# Patient Record
Sex: Female | Born: 1935
Health system: Southern US, Community
[De-identification: ages and names within clinical notes are randomized; demographics above are authoritative.]

## PROBLEM LIST (undated history)

## (undated) DIAGNOSIS — Z973 Presence of spectacles and contact lenses: Secondary | ICD-10-CM

## (undated) DIAGNOSIS — F419 Anxiety disorder, unspecified: Secondary | ICD-10-CM

## (undated) DIAGNOSIS — K219 Gastro-esophageal reflux disease without esophagitis: Secondary | ICD-10-CM

## (undated) DIAGNOSIS — S22000A Wedge compression fracture of unspecified thoracic vertebra, initial encounter for closed fracture: Secondary | ICD-10-CM

## (undated) DIAGNOSIS — M1990 Unspecified osteoarthritis, unspecified site: Secondary | ICD-10-CM

## (undated) DIAGNOSIS — J479 Bronchiectasis, uncomplicated: Secondary | ICD-10-CM

## (undated) DIAGNOSIS — S1121XA Laceration without foreign body of pharynx and cervical esophagus, initial encounter: Secondary | ICD-10-CM

## (undated) DIAGNOSIS — J189 Pneumonia, unspecified organism: Secondary | ICD-10-CM

## (undated) DIAGNOSIS — Z972 Presence of dental prosthetic device (complete) (partial): Secondary | ICD-10-CM

## (undated) HISTORY — PX: HERNIA REPAIR: SHX51

## (undated) HISTORY — PX: FOOT NEUROMA SURGERY: SHX646

## (undated) HISTORY — PX: MULTIPLE TOOTH EXTRACTIONS: SHX2053

## (undated) HISTORY — PX: APPENDECTOMY: SHX54

## (undated) HISTORY — PX: EYE SURGERY: SHX253

## (undated) HISTORY — PX: COLONOSCOPY: SHX174

## (undated) HISTORY — PX: TUBAL LIGATION: SHX77

## (undated) HISTORY — PX: CHOLECYSTECTOMY: SHX55

## (undated) HISTORY — PX: CATARACT EXTRACTION W/ INTRAOCULAR LENS  IMPLANT, BILATERAL: SHX1307

## (undated) HISTORY — PX: FRACTURE SURGERY: SHX138

---

## 2011-08-02 DIAGNOSIS — M25539 Pain in unspecified wrist: Secondary | ICD-10-CM | POA: Diagnosis not present

## 2011-08-06 DIAGNOSIS — M25539 Pain in unspecified wrist: Secondary | ICD-10-CM | POA: Diagnosis not present

## 2011-08-08 DIAGNOSIS — M25539 Pain in unspecified wrist: Secondary | ICD-10-CM | POA: Diagnosis not present

## 2011-08-14 DIAGNOSIS — M25539 Pain in unspecified wrist: Secondary | ICD-10-CM | POA: Diagnosis not present

## 2011-08-16 DIAGNOSIS — M810 Age-related osteoporosis without current pathological fracture: Secondary | ICD-10-CM | POA: Diagnosis not present

## 2011-08-21 DIAGNOSIS — M25539 Pain in unspecified wrist: Secondary | ICD-10-CM | POA: Diagnosis not present

## 2011-08-27 DIAGNOSIS — S52599A Other fractures of lower end of unspecified radius, initial encounter for closed fracture: Secondary | ICD-10-CM | POA: Diagnosis not present

## 2011-09-05 DIAGNOSIS — M25539 Pain in unspecified wrist: Secondary | ICD-10-CM | POA: Diagnosis not present

## 2011-09-17 DIAGNOSIS — R634 Abnormal weight loss: Secondary | ICD-10-CM | POA: Diagnosis not present

## 2011-09-17 DIAGNOSIS — R079 Chest pain, unspecified: Secondary | ICD-10-CM | POA: Diagnosis not present

## 2011-09-25 DIAGNOSIS — M25539 Pain in unspecified wrist: Secondary | ICD-10-CM | POA: Diagnosis not present

## 2011-10-04 DIAGNOSIS — M25539 Pain in unspecified wrist: Secondary | ICD-10-CM | POA: Diagnosis not present

## 2011-10-08 DIAGNOSIS — S52599A Other fractures of lower end of unspecified radius, initial encounter for closed fracture: Secondary | ICD-10-CM | POA: Diagnosis not present

## 2011-11-07 DIAGNOSIS — M810 Age-related osteoporosis without current pathological fracture: Secondary | ICD-10-CM | POA: Diagnosis not present

## 2011-11-07 DIAGNOSIS — Z713 Dietary counseling and surveillance: Secondary | ICD-10-CM | POA: Diagnosis not present

## 2011-11-07 DIAGNOSIS — K219 Gastro-esophageal reflux disease without esophagitis: Secondary | ICD-10-CM | POA: Diagnosis not present

## 2011-11-07 DIAGNOSIS — R634 Abnormal weight loss: Secondary | ICD-10-CM | POA: Diagnosis not present

## 2011-12-20 DIAGNOSIS — R634 Abnormal weight loss: Secondary | ICD-10-CM | POA: Diagnosis not present

## 2011-12-20 DIAGNOSIS — IMO0002 Reserved for concepts with insufficient information to code with codable children: Secondary | ICD-10-CM | POA: Diagnosis not present

## 2012-01-09 DIAGNOSIS — R509 Fever, unspecified: Secondary | ICD-10-CM | POA: Diagnosis not present

## 2012-01-09 DIAGNOSIS — R05 Cough: Secondary | ICD-10-CM | POA: Diagnosis not present

## 2012-01-10 DIAGNOSIS — J449 Chronic obstructive pulmonary disease, unspecified: Secondary | ICD-10-CM | POA: Diagnosis not present

## 2012-01-10 DIAGNOSIS — R918 Other nonspecific abnormal finding of lung field: Secondary | ICD-10-CM | POA: Diagnosis not present

## 2012-01-10 DIAGNOSIS — J189 Pneumonia, unspecified organism: Secondary | ICD-10-CM | POA: Diagnosis not present

## 2012-01-14 DIAGNOSIS — IMO0002 Reserved for concepts with insufficient information to code with codable children: Secondary | ICD-10-CM | POA: Diagnosis not present

## 2012-01-14 DIAGNOSIS — J189 Pneumonia, unspecified organism: Secondary | ICD-10-CM | POA: Diagnosis not present

## 2012-01-22 DIAGNOSIS — K449 Diaphragmatic hernia without obstruction or gangrene: Secondary | ICD-10-CM | POA: Diagnosis not present

## 2012-01-22 DIAGNOSIS — J189 Pneumonia, unspecified organism: Secondary | ICD-10-CM | POA: Diagnosis not present

## 2012-01-22 DIAGNOSIS — J438 Other emphysema: Secondary | ICD-10-CM | POA: Diagnosis not present

## 2012-02-05 DIAGNOSIS — K449 Diaphragmatic hernia without obstruction or gangrene: Secondary | ICD-10-CM | POA: Diagnosis not present

## 2012-02-05 DIAGNOSIS — J189 Pneumonia, unspecified organism: Secondary | ICD-10-CM | POA: Diagnosis not present

## 2012-02-21 DIAGNOSIS — IMO0002 Reserved for concepts with insufficient information to code with codable children: Secondary | ICD-10-CM | POA: Diagnosis not present

## 2012-02-21 DIAGNOSIS — G4700 Insomnia, unspecified: Secondary | ICD-10-CM | POA: Diagnosis not present

## 2012-02-21 DIAGNOSIS — J479 Bronchiectasis, uncomplicated: Secondary | ICD-10-CM | POA: Diagnosis not present

## 2012-02-21 DIAGNOSIS — M810 Age-related osteoporosis without current pathological fracture: Secondary | ICD-10-CM | POA: Diagnosis not present

## 2012-04-02 DIAGNOSIS — K219 Gastro-esophageal reflux disease without esophagitis: Secondary | ICD-10-CM | POA: Diagnosis not present

## 2012-04-02 DIAGNOSIS — IMO0002 Reserved for concepts with insufficient information to code with codable children: Secondary | ICD-10-CM | POA: Diagnosis not present

## 2012-04-02 DIAGNOSIS — J479 Bronchiectasis, uncomplicated: Secondary | ICD-10-CM | POA: Diagnosis not present

## 2012-05-29 DIAGNOSIS — M9981 Other biomechanical lesions of cervical region: Secondary | ICD-10-CM | POA: Diagnosis not present

## 2012-05-29 DIAGNOSIS — S139XXA Sprain of joints and ligaments of unspecified parts of neck, initial encounter: Secondary | ICD-10-CM | POA: Diagnosis not present

## 2012-06-02 DIAGNOSIS — M9981 Other biomechanical lesions of cervical region: Secondary | ICD-10-CM | POA: Diagnosis not present

## 2012-06-02 DIAGNOSIS — S139XXA Sprain of joints and ligaments of unspecified parts of neck, initial encounter: Secondary | ICD-10-CM | POA: Diagnosis not present

## 2012-06-04 DIAGNOSIS — S139XXA Sprain of joints and ligaments of unspecified parts of neck, initial encounter: Secondary | ICD-10-CM | POA: Diagnosis not present

## 2012-06-04 DIAGNOSIS — M9981 Other biomechanical lesions of cervical region: Secondary | ICD-10-CM | POA: Diagnosis not present

## 2012-06-05 DIAGNOSIS — M9981 Other biomechanical lesions of cervical region: Secondary | ICD-10-CM | POA: Diagnosis not present

## 2012-06-05 DIAGNOSIS — S139XXA Sprain of joints and ligaments of unspecified parts of neck, initial encounter: Secondary | ICD-10-CM | POA: Diagnosis not present

## 2012-06-16 DIAGNOSIS — M9981 Other biomechanical lesions of cervical region: Secondary | ICD-10-CM | POA: Diagnosis not present

## 2012-06-16 DIAGNOSIS — S139XXA Sprain of joints and ligaments of unspecified parts of neck, initial encounter: Secondary | ICD-10-CM | POA: Diagnosis not present

## 2012-06-18 DIAGNOSIS — M9981 Other biomechanical lesions of cervical region: Secondary | ICD-10-CM | POA: Diagnosis not present

## 2012-06-18 DIAGNOSIS — S139XXA Sprain of joints and ligaments of unspecified parts of neck, initial encounter: Secondary | ICD-10-CM | POA: Diagnosis not present

## 2012-06-19 DIAGNOSIS — M9981 Other biomechanical lesions of cervical region: Secondary | ICD-10-CM | POA: Diagnosis not present

## 2012-06-19 DIAGNOSIS — S139XXA Sprain of joints and ligaments of unspecified parts of neck, initial encounter: Secondary | ICD-10-CM | POA: Diagnosis not present

## 2012-06-23 DIAGNOSIS — M9981 Other biomechanical lesions of cervical region: Secondary | ICD-10-CM | POA: Diagnosis not present

## 2012-06-23 DIAGNOSIS — S139XXA Sprain of joints and ligaments of unspecified parts of neck, initial encounter: Secondary | ICD-10-CM | POA: Diagnosis not present

## 2012-06-24 DIAGNOSIS — M9981 Other biomechanical lesions of cervical region: Secondary | ICD-10-CM | POA: Diagnosis not present

## 2012-06-24 DIAGNOSIS — S139XXA Sprain of joints and ligaments of unspecified parts of neck, initial encounter: Secondary | ICD-10-CM | POA: Diagnosis not present

## 2012-06-25 DIAGNOSIS — S139XXA Sprain of joints and ligaments of unspecified parts of neck, initial encounter: Secondary | ICD-10-CM | POA: Diagnosis not present

## 2012-06-25 DIAGNOSIS — M9981 Other biomechanical lesions of cervical region: Secondary | ICD-10-CM | POA: Diagnosis not present

## 2012-07-01 DIAGNOSIS — S139XXA Sprain of joints and ligaments of unspecified parts of neck, initial encounter: Secondary | ICD-10-CM | POA: Diagnosis not present

## 2012-07-01 DIAGNOSIS — M9981 Other biomechanical lesions of cervical region: Secondary | ICD-10-CM | POA: Diagnosis not present

## 2012-07-02 DIAGNOSIS — M9981 Other biomechanical lesions of cervical region: Secondary | ICD-10-CM | POA: Diagnosis not present

## 2012-07-02 DIAGNOSIS — S139XXA Sprain of joints and ligaments of unspecified parts of neck, initial encounter: Secondary | ICD-10-CM | POA: Diagnosis not present

## 2012-07-07 DIAGNOSIS — S139XXA Sprain of joints and ligaments of unspecified parts of neck, initial encounter: Secondary | ICD-10-CM | POA: Diagnosis not present

## 2012-07-07 DIAGNOSIS — M9981 Other biomechanical lesions of cervical region: Secondary | ICD-10-CM | POA: Diagnosis not present

## 2012-07-10 DIAGNOSIS — S139XXA Sprain of joints and ligaments of unspecified parts of neck, initial encounter: Secondary | ICD-10-CM | POA: Diagnosis not present

## 2012-07-10 DIAGNOSIS — M9981 Other biomechanical lesions of cervical region: Secondary | ICD-10-CM | POA: Diagnosis not present

## 2012-07-14 DIAGNOSIS — S139XXA Sprain of joints and ligaments of unspecified parts of neck, initial encounter: Secondary | ICD-10-CM | POA: Diagnosis not present

## 2012-07-14 DIAGNOSIS — M9981 Other biomechanical lesions of cervical region: Secondary | ICD-10-CM | POA: Diagnosis not present

## 2012-07-17 DIAGNOSIS — M9981 Other biomechanical lesions of cervical region: Secondary | ICD-10-CM | POA: Diagnosis not present

## 2012-07-17 DIAGNOSIS — S139XXA Sprain of joints and ligaments of unspecified parts of neck, initial encounter: Secondary | ICD-10-CM | POA: Diagnosis not present

## 2012-07-21 DIAGNOSIS — M9981 Other biomechanical lesions of cervical region: Secondary | ICD-10-CM | POA: Diagnosis not present

## 2012-07-21 DIAGNOSIS — S139XXA Sprain of joints and ligaments of unspecified parts of neck, initial encounter: Secondary | ICD-10-CM | POA: Diagnosis not present

## 2012-07-28 DIAGNOSIS — M9981 Other biomechanical lesions of cervical region: Secondary | ICD-10-CM | POA: Diagnosis not present

## 2012-07-28 DIAGNOSIS — S139XXA Sprain of joints and ligaments of unspecified parts of neck, initial encounter: Secondary | ICD-10-CM | POA: Diagnosis not present

## 2012-08-07 DIAGNOSIS — S139XXA Sprain of joints and ligaments of unspecified parts of neck, initial encounter: Secondary | ICD-10-CM | POA: Diagnosis not present

## 2012-08-07 DIAGNOSIS — M9981 Other biomechanical lesions of cervical region: Secondary | ICD-10-CM | POA: Diagnosis not present

## 2012-08-08 DIAGNOSIS — J479 Bronchiectasis, uncomplicated: Secondary | ICD-10-CM | POA: Diagnosis not present

## 2012-08-08 DIAGNOSIS — K219 Gastro-esophageal reflux disease without esophagitis: Secondary | ICD-10-CM | POA: Diagnosis not present

## 2012-08-08 DIAGNOSIS — J209 Acute bronchitis, unspecified: Secondary | ICD-10-CM | POA: Diagnosis not present

## 2012-08-08 DIAGNOSIS — M810 Age-related osteoporosis without current pathological fracture: Secondary | ICD-10-CM | POA: Diagnosis not present

## 2012-08-29 DIAGNOSIS — M810 Age-related osteoporosis without current pathological fracture: Secondary | ICD-10-CM | POA: Diagnosis not present

## 2012-11-06 DIAGNOSIS — J209 Acute bronchitis, unspecified: Secondary | ICD-10-CM | POA: Diagnosis not present

## 2012-11-06 DIAGNOSIS — J309 Allergic rhinitis, unspecified: Secondary | ICD-10-CM | POA: Diagnosis not present

## 2012-11-06 DIAGNOSIS — IMO0002 Reserved for concepts with insufficient information to code with codable children: Secondary | ICD-10-CM | POA: Diagnosis not present

## 2013-02-25 DIAGNOSIS — M810 Age-related osteoporosis without current pathological fracture: Secondary | ICD-10-CM | POA: Diagnosis not present

## 2013-06-19 DIAGNOSIS — J209 Acute bronchitis, unspecified: Secondary | ICD-10-CM | POA: Diagnosis not present

## 2013-06-19 DIAGNOSIS — J479 Bronchiectasis, uncomplicated: Secondary | ICD-10-CM | POA: Diagnosis not present

## 2013-06-19 DIAGNOSIS — J309 Allergic rhinitis, unspecified: Secondary | ICD-10-CM | POA: Diagnosis not present

## 2013-06-19 DIAGNOSIS — J019 Acute sinusitis, unspecified: Secondary | ICD-10-CM | POA: Diagnosis not present

## 2013-09-07 DIAGNOSIS — M810 Age-related osteoporosis without current pathological fracture: Secondary | ICD-10-CM | POA: Diagnosis not present

## 2013-09-07 DIAGNOSIS — Z681 Body mass index (BMI) 19 or less, adult: Secondary | ICD-10-CM | POA: Diagnosis not present

## 2013-09-07 DIAGNOSIS — J479 Bronchiectasis, uncomplicated: Secondary | ICD-10-CM | POA: Diagnosis not present

## 2013-09-07 DIAGNOSIS — J209 Acute bronchitis, unspecified: Secondary | ICD-10-CM | POA: Diagnosis not present

## 2013-09-21 DIAGNOSIS — M810 Age-related osteoporosis without current pathological fracture: Secondary | ICD-10-CM | POA: Diagnosis not present

## 2013-10-06 DIAGNOSIS — L578 Other skin changes due to chronic exposure to nonionizing radiation: Secondary | ICD-10-CM | POA: Diagnosis not present

## 2013-10-06 DIAGNOSIS — C44320 Squamous cell carcinoma of skin of unspecified parts of face: Secondary | ICD-10-CM | POA: Diagnosis not present

## 2013-10-06 DIAGNOSIS — C44319 Basal cell carcinoma of skin of other parts of face: Secondary | ICD-10-CM | POA: Diagnosis not present

## 2013-12-04 DIAGNOSIS — L821 Other seborrheic keratosis: Secondary | ICD-10-CM | POA: Diagnosis not present

## 2013-12-04 DIAGNOSIS — R233 Spontaneous ecchymoses: Secondary | ICD-10-CM | POA: Diagnosis not present

## 2013-12-11 DIAGNOSIS — K219 Gastro-esophageal reflux disease without esophagitis: Secondary | ICD-10-CM | POA: Diagnosis not present

## 2013-12-11 DIAGNOSIS — E785 Hyperlipidemia, unspecified: Secondary | ICD-10-CM | POA: Diagnosis not present

## 2013-12-11 DIAGNOSIS — Z9181 History of falling: Secondary | ICD-10-CM | POA: Diagnosis not present

## 2013-12-11 DIAGNOSIS — D510 Vitamin B12 deficiency anemia due to intrinsic factor deficiency: Secondary | ICD-10-CM | POA: Diagnosis not present

## 2013-12-11 DIAGNOSIS — Z681 Body mass index (BMI) 19 or less, adult: Secondary | ICD-10-CM | POA: Diagnosis not present

## 2013-12-11 DIAGNOSIS — Z1331 Encounter for screening for depression: Secondary | ICD-10-CM | POA: Diagnosis not present

## 2013-12-11 DIAGNOSIS — J479 Bronchiectasis, uncomplicated: Secondary | ICD-10-CM | POA: Diagnosis not present

## 2013-12-11 DIAGNOSIS — M810 Age-related osteoporosis without current pathological fracture: Secondary | ICD-10-CM | POA: Diagnosis not present

## 2014-01-04 DIAGNOSIS — M810 Age-related osteoporosis without current pathological fracture: Secondary | ICD-10-CM | POA: Diagnosis not present

## 2014-01-04 DIAGNOSIS — Z1231 Encounter for screening mammogram for malignant neoplasm of breast: Secondary | ICD-10-CM | POA: Diagnosis not present

## 2014-04-08 DIAGNOSIS — M810 Age-related osteoporosis without current pathological fracture: Secondary | ICD-10-CM | POA: Diagnosis not present

## 2014-09-28 DIAGNOSIS — E559 Vitamin D deficiency, unspecified: Secondary | ICD-10-CM | POA: Diagnosis not present

## 2014-09-28 DIAGNOSIS — E785 Hyperlipidemia, unspecified: Secondary | ICD-10-CM | POA: Diagnosis not present

## 2014-09-28 DIAGNOSIS — F419 Anxiety disorder, unspecified: Secondary | ICD-10-CM | POA: Diagnosis not present

## 2014-09-28 DIAGNOSIS — K219 Gastro-esophageal reflux disease without esophagitis: Secondary | ICD-10-CM | POA: Diagnosis not present

## 2014-09-28 DIAGNOSIS — M179 Osteoarthritis of knee, unspecified: Secondary | ICD-10-CM | POA: Diagnosis not present

## 2014-09-28 DIAGNOSIS — M810 Age-related osteoporosis without current pathological fracture: Secondary | ICD-10-CM | POA: Diagnosis not present

## 2014-09-28 DIAGNOSIS — Z23 Encounter for immunization: Secondary | ICD-10-CM | POA: Diagnosis not present

## 2014-09-28 DIAGNOSIS — J479 Bronchiectasis, uncomplicated: Secondary | ICD-10-CM | POA: Diagnosis not present

## 2014-09-28 DIAGNOSIS — D510 Vitamin B12 deficiency anemia due to intrinsic factor deficiency: Secondary | ICD-10-CM | POA: Diagnosis not present

## 2014-10-01 DIAGNOSIS — M1711 Unilateral primary osteoarthritis, right knee: Secondary | ICD-10-CM | POA: Diagnosis not present

## 2014-10-11 DIAGNOSIS — M810 Age-related osteoporosis without current pathological fracture: Secondary | ICD-10-CM | POA: Diagnosis not present

## 2014-10-14 DIAGNOSIS — R61 Generalized hyperhidrosis: Secondary | ICD-10-CM | POA: Diagnosis not present

## 2014-10-14 DIAGNOSIS — S00411A Abrasion of right ear, initial encounter: Secondary | ICD-10-CM | POA: Diagnosis not present

## 2014-12-03 DIAGNOSIS — Z1389 Encounter for screening for other disorder: Secondary | ICD-10-CM | POA: Diagnosis not present

## 2014-12-03 DIAGNOSIS — R61 Generalized hyperhidrosis: Secondary | ICD-10-CM | POA: Diagnosis not present

## 2015-03-16 DIAGNOSIS — M1990 Unspecified osteoarthritis, unspecified site: Secondary | ICD-10-CM | POA: Diagnosis not present

## 2015-03-16 DIAGNOSIS — Z681 Body mass index (BMI) 19 or less, adult: Secondary | ICD-10-CM | POA: Diagnosis not present

## 2015-03-16 DIAGNOSIS — F419 Anxiety disorder, unspecified: Secondary | ICD-10-CM | POA: Diagnosis not present

## 2015-03-16 DIAGNOSIS — K219 Gastro-esophageal reflux disease without esophagitis: Secondary | ICD-10-CM | POA: Diagnosis not present

## 2015-04-07 DIAGNOSIS — M810 Age-related osteoporosis without current pathological fracture: Secondary | ICD-10-CM | POA: Diagnosis not present

## 2015-04-07 DIAGNOSIS — Z9181 History of falling: Secondary | ICD-10-CM | POA: Diagnosis not present

## 2015-04-07 DIAGNOSIS — Z681 Body mass index (BMI) 19 or less, adult: Secondary | ICD-10-CM | POA: Diagnosis not present

## 2015-04-07 DIAGNOSIS — K219 Gastro-esophageal reflux disease without esophagitis: Secondary | ICD-10-CM | POA: Diagnosis not present

## 2015-04-07 DIAGNOSIS — J479 Bronchiectasis, uncomplicated: Secondary | ICD-10-CM | POA: Diagnosis not present

## 2015-04-07 DIAGNOSIS — F419 Anxiety disorder, unspecified: Secondary | ICD-10-CM | POA: Diagnosis not present

## 2015-04-15 DIAGNOSIS — M810 Age-related osteoporosis without current pathological fracture: Secondary | ICD-10-CM | POA: Diagnosis not present

## 2015-08-05 DIAGNOSIS — M170 Bilateral primary osteoarthritis of knee: Secondary | ICD-10-CM | POA: Diagnosis not present

## 2015-08-05 DIAGNOSIS — M25562 Pain in left knee: Secondary | ICD-10-CM | POA: Diagnosis not present

## 2015-08-05 DIAGNOSIS — M25561 Pain in right knee: Secondary | ICD-10-CM | POA: Diagnosis not present

## 2015-08-09 DIAGNOSIS — M25562 Pain in left knee: Secondary | ICD-10-CM | POA: Diagnosis not present

## 2015-08-09 DIAGNOSIS — M1712 Unilateral primary osteoarthritis, left knee: Secondary | ICD-10-CM | POA: Diagnosis not present

## 2015-08-12 DIAGNOSIS — M1711 Unilateral primary osteoarthritis, right knee: Secondary | ICD-10-CM | POA: Diagnosis not present

## 2015-08-12 DIAGNOSIS — M25561 Pain in right knee: Secondary | ICD-10-CM | POA: Diagnosis not present

## 2015-08-17 DIAGNOSIS — M25562 Pain in left knee: Secondary | ICD-10-CM | POA: Diagnosis not present

## 2015-08-17 DIAGNOSIS — M1712 Unilateral primary osteoarthritis, left knee: Secondary | ICD-10-CM | POA: Diagnosis not present

## 2015-08-19 DIAGNOSIS — M25561 Pain in right knee: Secondary | ICD-10-CM | POA: Diagnosis not present

## 2015-08-19 DIAGNOSIS — M1711 Unilateral primary osteoarthritis, right knee: Secondary | ICD-10-CM | POA: Diagnosis not present

## 2015-08-22 DIAGNOSIS — M1712 Unilateral primary osteoarthritis, left knee: Secondary | ICD-10-CM | POA: Diagnosis not present

## 2015-08-22 DIAGNOSIS — M25562 Pain in left knee: Secondary | ICD-10-CM | POA: Diagnosis not present

## 2015-08-25 DIAGNOSIS — M1711 Unilateral primary osteoarthritis, right knee: Secondary | ICD-10-CM | POA: Diagnosis not present

## 2015-08-25 DIAGNOSIS — M25561 Pain in right knee: Secondary | ICD-10-CM | POA: Diagnosis not present

## 2015-08-29 DIAGNOSIS — M1712 Unilateral primary osteoarthritis, left knee: Secondary | ICD-10-CM | POA: Diagnosis not present

## 2015-08-29 DIAGNOSIS — M25562 Pain in left knee: Secondary | ICD-10-CM | POA: Diagnosis not present

## 2015-09-01 DIAGNOSIS — M1711 Unilateral primary osteoarthritis, right knee: Secondary | ICD-10-CM | POA: Diagnosis not present

## 2015-09-01 DIAGNOSIS — M25561 Pain in right knee: Secondary | ICD-10-CM | POA: Diagnosis not present

## 2015-10-05 DIAGNOSIS — Z681 Body mass index (BMI) 19 or less, adult: Secondary | ICD-10-CM | POA: Diagnosis not present

## 2015-10-05 DIAGNOSIS — J479 Bronchiectasis, uncomplicated: Secondary | ICD-10-CM | POA: Diagnosis not present

## 2015-10-05 DIAGNOSIS — K219 Gastro-esophageal reflux disease without esophagitis: Secondary | ICD-10-CM | POA: Diagnosis not present

## 2015-10-05 DIAGNOSIS — M810 Age-related osteoporosis without current pathological fracture: Secondary | ICD-10-CM | POA: Diagnosis not present

## 2015-10-05 DIAGNOSIS — R634 Abnormal weight loss: Secondary | ICD-10-CM | POA: Diagnosis not present

## 2015-10-05 DIAGNOSIS — D510 Vitamin B12 deficiency anemia due to intrinsic factor deficiency: Secondary | ICD-10-CM | POA: Diagnosis not present

## 2015-10-05 DIAGNOSIS — E785 Hyperlipidemia, unspecified: Secondary | ICD-10-CM | POA: Diagnosis not present

## 2015-10-05 DIAGNOSIS — E559 Vitamin D deficiency, unspecified: Secondary | ICD-10-CM | POA: Diagnosis not present

## 2015-10-14 DIAGNOSIS — M810 Age-related osteoporosis without current pathological fracture: Secondary | ICD-10-CM | POA: Diagnosis not present

## 2015-11-29 DIAGNOSIS — Z681 Body mass index (BMI) 19 or less, adult: Secondary | ICD-10-CM | POA: Diagnosis not present

## 2015-11-29 DIAGNOSIS — R636 Underweight: Secondary | ICD-10-CM | POA: Diagnosis not present

## 2015-12-05 DIAGNOSIS — M25561 Pain in right knee: Secondary | ICD-10-CM | POA: Diagnosis not present

## 2015-12-05 DIAGNOSIS — M25562 Pain in left knee: Secondary | ICD-10-CM | POA: Diagnosis not present

## 2015-12-05 DIAGNOSIS — R262 Difficulty in walking, not elsewhere classified: Secondary | ICD-10-CM | POA: Diagnosis not present

## 2015-12-05 DIAGNOSIS — M170 Bilateral primary osteoarthritis of knee: Secondary | ICD-10-CM | POA: Diagnosis not present

## 2015-12-14 DIAGNOSIS — M25561 Pain in right knee: Secondary | ICD-10-CM | POA: Diagnosis not present

## 2015-12-14 DIAGNOSIS — M170 Bilateral primary osteoarthritis of knee: Secondary | ICD-10-CM | POA: Diagnosis not present

## 2015-12-14 DIAGNOSIS — M25562 Pain in left knee: Secondary | ICD-10-CM | POA: Diagnosis not present

## 2016-01-11 DIAGNOSIS — Z9181 History of falling: Secondary | ICD-10-CM | POA: Diagnosis not present

## 2016-01-11 DIAGNOSIS — F419 Anxiety disorder, unspecified: Secondary | ICD-10-CM | POA: Diagnosis not present

## 2016-01-11 DIAGNOSIS — Z681 Body mass index (BMI) 19 or less, adult: Secondary | ICD-10-CM | POA: Diagnosis not present

## 2016-01-11 DIAGNOSIS — Z1389 Encounter for screening for other disorder: Secondary | ICD-10-CM | POA: Diagnosis not present

## 2016-01-11 DIAGNOSIS — K219 Gastro-esophageal reflux disease without esophagitis: Secondary | ICD-10-CM | POA: Diagnosis not present

## 2016-01-11 DIAGNOSIS — R634 Abnormal weight loss: Secondary | ICD-10-CM | POA: Diagnosis not present

## 2016-01-11 DIAGNOSIS — E785 Hyperlipidemia, unspecified: Secondary | ICD-10-CM | POA: Diagnosis not present

## 2016-01-11 DIAGNOSIS — J479 Bronchiectasis, uncomplicated: Secondary | ICD-10-CM | POA: Diagnosis not present

## 2016-01-11 DIAGNOSIS — M810 Age-related osteoporosis without current pathological fracture: Secondary | ICD-10-CM | POA: Diagnosis not present

## 2016-01-11 DIAGNOSIS — D510 Vitamin B12 deficiency anemia due to intrinsic factor deficiency: Secondary | ICD-10-CM | POA: Diagnosis not present

## 2016-04-12 DIAGNOSIS — F419 Anxiety disorder, unspecified: Secondary | ICD-10-CM | POA: Diagnosis not present

## 2016-04-12 DIAGNOSIS — M810 Age-related osteoporosis without current pathological fracture: Secondary | ICD-10-CM | POA: Diagnosis not present

## 2016-04-12 DIAGNOSIS — J479 Bronchiectasis, uncomplicated: Secondary | ICD-10-CM | POA: Diagnosis not present

## 2016-04-12 DIAGNOSIS — K219 Gastro-esophageal reflux disease without esophagitis: Secondary | ICD-10-CM | POA: Diagnosis not present

## 2016-04-12 DIAGNOSIS — R634 Abnormal weight loss: Secondary | ICD-10-CM | POA: Diagnosis not present

## 2016-05-24 DIAGNOSIS — H25812 Combined forms of age-related cataract, left eye: Secondary | ICD-10-CM | POA: Diagnosis not present

## 2016-05-24 DIAGNOSIS — H25811 Combined forms of age-related cataract, right eye: Secondary | ICD-10-CM | POA: Diagnosis not present

## 2016-05-24 DIAGNOSIS — H35371 Puckering of macula, right eye: Secondary | ICD-10-CM | POA: Diagnosis not present

## 2016-06-11 DIAGNOSIS — H2511 Age-related nuclear cataract, right eye: Secondary | ICD-10-CM | POA: Diagnosis not present

## 2016-06-11 DIAGNOSIS — H25811 Combined forms of age-related cataract, right eye: Secondary | ICD-10-CM | POA: Diagnosis not present

## 2016-07-09 DIAGNOSIS — H2512 Age-related nuclear cataract, left eye: Secondary | ICD-10-CM | POA: Diagnosis not present

## 2016-07-09 DIAGNOSIS — H25812 Combined forms of age-related cataract, left eye: Secondary | ICD-10-CM | POA: Diagnosis not present

## 2016-07-17 DIAGNOSIS — Z23 Encounter for immunization: Secondary | ICD-10-CM | POA: Diagnosis not present

## 2016-07-17 DIAGNOSIS — J479 Bronchiectasis, uncomplicated: Secondary | ICD-10-CM | POA: Diagnosis not present

## 2016-07-17 DIAGNOSIS — Z2821 Immunization not carried out because of patient refusal: Secondary | ICD-10-CM | POA: Diagnosis not present

## 2016-07-17 DIAGNOSIS — E559 Vitamin D deficiency, unspecified: Secondary | ICD-10-CM | POA: Diagnosis not present

## 2016-07-17 DIAGNOSIS — K219 Gastro-esophageal reflux disease without esophagitis: Secondary | ICD-10-CM | POA: Diagnosis not present

## 2016-07-17 DIAGNOSIS — D510 Vitamin B12 deficiency anemia due to intrinsic factor deficiency: Secondary | ICD-10-CM | POA: Diagnosis not present

## 2016-07-17 DIAGNOSIS — R636 Underweight: Secondary | ICD-10-CM | POA: Diagnosis not present

## 2016-07-17 DIAGNOSIS — M810 Age-related osteoporosis without current pathological fracture: Secondary | ICD-10-CM | POA: Diagnosis not present

## 2016-07-17 DIAGNOSIS — R634 Abnormal weight loss: Secondary | ICD-10-CM | POA: Diagnosis not present

## 2016-08-30 DIAGNOSIS — Z1211 Encounter for screening for malignant neoplasm of colon: Secondary | ICD-10-CM | POA: Diagnosis not present

## 2016-08-30 DIAGNOSIS — Z136 Encounter for screening for cardiovascular disorders: Secondary | ICD-10-CM | POA: Diagnosis not present

## 2016-08-30 DIAGNOSIS — Z9181 History of falling: Secondary | ICD-10-CM | POA: Diagnosis not present

## 2016-08-30 DIAGNOSIS — Z0000 Encounter for general adult medical examination without abnormal findings: Secondary | ICD-10-CM | POA: Diagnosis not present

## 2016-08-30 DIAGNOSIS — Z1389 Encounter for screening for other disorder: Secondary | ICD-10-CM | POA: Diagnosis not present

## 2016-08-30 DIAGNOSIS — Z1231 Encounter for screening mammogram for malignant neoplasm of breast: Secondary | ICD-10-CM | POA: Diagnosis not present

## 2016-08-30 DIAGNOSIS — N959 Unspecified menopausal and perimenopausal disorder: Secondary | ICD-10-CM | POA: Diagnosis not present

## 2016-09-24 DIAGNOSIS — M810 Age-related osteoporosis without current pathological fracture: Secondary | ICD-10-CM | POA: Diagnosis not present

## 2016-10-15 DIAGNOSIS — M810 Age-related osteoporosis without current pathological fracture: Secondary | ICD-10-CM | POA: Diagnosis not present

## 2016-10-31 DIAGNOSIS — M810 Age-related osteoporosis without current pathological fracture: Secondary | ICD-10-CM | POA: Diagnosis not present

## 2016-10-31 DIAGNOSIS — M170 Bilateral primary osteoarthritis of knee: Secondary | ICD-10-CM | POA: Diagnosis not present

## 2016-10-31 DIAGNOSIS — J479 Bronchiectasis, uncomplicated: Secondary | ICD-10-CM | POA: Diagnosis not present

## 2016-10-31 DIAGNOSIS — K219 Gastro-esophageal reflux disease without esophagitis: Secondary | ICD-10-CM | POA: Diagnosis not present

## 2016-10-31 DIAGNOSIS — F419 Anxiety disorder, unspecified: Secondary | ICD-10-CM | POA: Diagnosis not present

## 2017-02-25 DIAGNOSIS — F419 Anxiety disorder, unspecified: Secondary | ICD-10-CM | POA: Diagnosis not present

## 2017-02-25 DIAGNOSIS — J479 Bronchiectasis, uncomplicated: Secondary | ICD-10-CM | POA: Diagnosis not present

## 2017-02-25 DIAGNOSIS — Z681 Body mass index (BMI) 19 or less, adult: Secondary | ICD-10-CM | POA: Diagnosis not present

## 2017-02-25 DIAGNOSIS — E785 Hyperlipidemia, unspecified: Secondary | ICD-10-CM | POA: Diagnosis not present

## 2017-02-25 DIAGNOSIS — M810 Age-related osteoporosis without current pathological fracture: Secondary | ICD-10-CM | POA: Diagnosis not present

## 2017-02-25 DIAGNOSIS — K219 Gastro-esophageal reflux disease without esophagitis: Secondary | ICD-10-CM | POA: Diagnosis not present

## 2017-02-25 DIAGNOSIS — R634 Abnormal weight loss: Secondary | ICD-10-CM | POA: Diagnosis not present

## 2017-04-08 DIAGNOSIS — Z9181 History of falling: Secondary | ICD-10-CM | POA: Diagnosis not present

## 2017-04-08 DIAGNOSIS — R634 Abnormal weight loss: Secondary | ICD-10-CM | POA: Diagnosis not present

## 2017-04-08 DIAGNOSIS — M810 Age-related osteoporosis without current pathological fracture: Secondary | ICD-10-CM | POA: Diagnosis not present

## 2017-04-08 DIAGNOSIS — Z1389 Encounter for screening for other disorder: Secondary | ICD-10-CM | POA: Diagnosis not present

## 2017-04-24 DIAGNOSIS — M810 Age-related osteoporosis without current pathological fracture: Secondary | ICD-10-CM | POA: Diagnosis not present

## 2017-06-05 DIAGNOSIS — J479 Bronchiectasis, uncomplicated: Secondary | ICD-10-CM | POA: Diagnosis not present

## 2017-06-05 DIAGNOSIS — R636 Underweight: Secondary | ICD-10-CM | POA: Diagnosis not present

## 2017-06-05 DIAGNOSIS — Z2821 Immunization not carried out because of patient refusal: Secondary | ICD-10-CM | POA: Diagnosis not present

## 2017-06-05 DIAGNOSIS — M810 Age-related osteoporosis without current pathological fracture: Secondary | ICD-10-CM | POA: Diagnosis not present

## 2017-06-05 DIAGNOSIS — R634 Abnormal weight loss: Secondary | ICD-10-CM | POA: Diagnosis not present

## 2017-06-05 DIAGNOSIS — K219 Gastro-esophageal reflux disease without esophagitis: Secondary | ICD-10-CM | POA: Diagnosis not present

## 2017-06-05 DIAGNOSIS — F419 Anxiety disorder, unspecified: Secondary | ICD-10-CM | POA: Diagnosis not present

## 2017-09-10 DIAGNOSIS — M810 Age-related osteoporosis without current pathological fracture: Secondary | ICD-10-CM | POA: Diagnosis not present

## 2017-09-10 DIAGNOSIS — E785 Hyperlipidemia, unspecified: Secondary | ICD-10-CM | POA: Diagnosis not present

## 2017-09-10 DIAGNOSIS — J479 Bronchiectasis, uncomplicated: Secondary | ICD-10-CM | POA: Diagnosis not present

## 2017-09-10 DIAGNOSIS — R61 Generalized hyperhidrosis: Secondary | ICD-10-CM | POA: Diagnosis not present

## 2017-09-10 DIAGNOSIS — Z2821 Immunization not carried out because of patient refusal: Secondary | ICD-10-CM | POA: Diagnosis not present

## 2017-09-10 DIAGNOSIS — M1990 Unspecified osteoarthritis, unspecified site: Secondary | ICD-10-CM | POA: Diagnosis not present

## 2017-10-08 DIAGNOSIS — M25562 Pain in left knee: Secondary | ICD-10-CM | POA: Diagnosis not present

## 2017-10-08 DIAGNOSIS — M4185 Other forms of scoliosis, thoracolumbar region: Secondary | ICD-10-CM | POA: Diagnosis not present

## 2017-10-08 DIAGNOSIS — M545 Low back pain: Secondary | ICD-10-CM | POA: Diagnosis not present

## 2017-10-08 DIAGNOSIS — M25511 Pain in right shoulder: Secondary | ICD-10-CM | POA: Diagnosis not present

## 2017-10-08 DIAGNOSIS — M25461 Effusion, right knee: Secondary | ICD-10-CM | POA: Diagnosis not present

## 2017-10-08 DIAGNOSIS — M19011 Primary osteoarthritis, right shoulder: Secondary | ICD-10-CM | POA: Diagnosis not present

## 2017-10-08 DIAGNOSIS — M25561 Pain in right knee: Secondary | ICD-10-CM | POA: Diagnosis not present

## 2017-10-08 DIAGNOSIS — M1712 Unilateral primary osteoarthritis, left knee: Secondary | ICD-10-CM | POA: Diagnosis not present

## 2017-10-08 DIAGNOSIS — M5136 Other intervertebral disc degeneration, lumbar region: Secondary | ICD-10-CM | POA: Diagnosis not present

## 2017-10-08 DIAGNOSIS — M542 Cervicalgia: Secondary | ICD-10-CM | POA: Diagnosis not present

## 2017-10-08 DIAGNOSIS — M5134 Other intervertebral disc degeneration, thoracic region: Secondary | ICD-10-CM | POA: Diagnosis not present

## 2017-10-08 DIAGNOSIS — M1711 Unilateral primary osteoarthritis, right knee: Secondary | ICD-10-CM | POA: Diagnosis not present

## 2017-10-08 DIAGNOSIS — M4184 Other forms of scoliosis, thoracic region: Secondary | ICD-10-CM | POA: Diagnosis not present

## 2017-10-08 DIAGNOSIS — M25462 Effusion, left knee: Secondary | ICD-10-CM | POA: Diagnosis not present

## 2017-10-30 DIAGNOSIS — M810 Age-related osteoporosis without current pathological fracture: Secondary | ICD-10-CM | POA: Diagnosis not present

## 2017-11-07 DIAGNOSIS — S81802A Unspecified open wound, left lower leg, initial encounter: Secondary | ICD-10-CM | POA: Diagnosis not present

## 2017-12-10 DIAGNOSIS — M810 Age-related osteoporosis without current pathological fracture: Secondary | ICD-10-CM | POA: Diagnosis not present

## 2017-12-10 DIAGNOSIS — E785 Hyperlipidemia, unspecified: Secondary | ICD-10-CM | POA: Diagnosis not present

## 2017-12-10 DIAGNOSIS — M1990 Unspecified osteoarthritis, unspecified site: Secondary | ICD-10-CM | POA: Diagnosis not present

## 2017-12-10 DIAGNOSIS — J479 Bronchiectasis, uncomplicated: Secondary | ICD-10-CM | POA: Diagnosis not present

## 2018-01-09 DIAGNOSIS — E538 Deficiency of other specified B group vitamins: Secondary | ICD-10-CM | POA: Diagnosis not present

## 2018-01-09 DIAGNOSIS — E559 Vitamin D deficiency, unspecified: Secondary | ICD-10-CM | POA: Diagnosis not present

## 2018-01-09 DIAGNOSIS — R634 Abnormal weight loss: Secondary | ICD-10-CM | POA: Diagnosis not present

## 2018-01-09 DIAGNOSIS — F419 Anxiety disorder, unspecified: Secondary | ICD-10-CM | POA: Diagnosis not present

## 2018-01-16 DIAGNOSIS — D510 Vitamin B12 deficiency anemia due to intrinsic factor deficiency: Secondary | ICD-10-CM | POA: Diagnosis not present

## 2018-01-22 DIAGNOSIS — D510 Vitamin B12 deficiency anemia due to intrinsic factor deficiency: Secondary | ICD-10-CM | POA: Diagnosis not present

## 2018-01-31 DIAGNOSIS — D510 Vitamin B12 deficiency anemia due to intrinsic factor deficiency: Secondary | ICD-10-CM | POA: Diagnosis not present

## 2018-02-18 DIAGNOSIS — E538 Deficiency of other specified B group vitamins: Secondary | ICD-10-CM | POA: Diagnosis not present

## 2018-02-18 DIAGNOSIS — D510 Vitamin B12 deficiency anemia due to intrinsic factor deficiency: Secondary | ICD-10-CM | POA: Diagnosis not present

## 2018-02-18 DIAGNOSIS — F419 Anxiety disorder, unspecified: Secondary | ICD-10-CM | POA: Diagnosis not present

## 2018-02-18 DIAGNOSIS — R634 Abnormal weight loss: Secondary | ICD-10-CM | POA: Diagnosis not present

## 2018-03-04 DIAGNOSIS — D510 Vitamin B12 deficiency anemia due to intrinsic factor deficiency: Secondary | ICD-10-CM | POA: Diagnosis not present

## 2018-04-08 DIAGNOSIS — D510 Vitamin B12 deficiency anemia due to intrinsic factor deficiency: Secondary | ICD-10-CM | POA: Diagnosis not present

## 2018-05-12 DIAGNOSIS — D510 Vitamin B12 deficiency anemia due to intrinsic factor deficiency: Secondary | ICD-10-CM | POA: Diagnosis not present

## 2018-05-19 DIAGNOSIS — M170 Bilateral primary osteoarthritis of knee: Secondary | ICD-10-CM | POA: Diagnosis not present

## 2018-05-19 DIAGNOSIS — M25561 Pain in right knee: Secondary | ICD-10-CM | POA: Diagnosis not present

## 2018-05-19 DIAGNOSIS — M25562 Pain in left knee: Secondary | ICD-10-CM | POA: Diagnosis not present

## 2018-05-19 DIAGNOSIS — M1711 Unilateral primary osteoarthritis, right knee: Secondary | ICD-10-CM | POA: Diagnosis not present

## 2018-05-22 DIAGNOSIS — Z1331 Encounter for screening for depression: Secondary | ICD-10-CM | POA: Diagnosis not present

## 2018-05-22 DIAGNOSIS — Z1339 Encounter for screening examination for other mental health and behavioral disorders: Secondary | ICD-10-CM | POA: Diagnosis not present

## 2018-05-22 DIAGNOSIS — E538 Deficiency of other specified B group vitamins: Secondary | ICD-10-CM | POA: Diagnosis not present

## 2018-05-22 DIAGNOSIS — F419 Anxiety disorder, unspecified: Secondary | ICD-10-CM | POA: Diagnosis not present

## 2018-05-22 DIAGNOSIS — R634 Abnormal weight loss: Secondary | ICD-10-CM | POA: Diagnosis not present

## 2018-05-22 DIAGNOSIS — E785 Hyperlipidemia, unspecified: Secondary | ICD-10-CM | POA: Diagnosis not present

## 2018-05-22 DIAGNOSIS — Z136 Encounter for screening for cardiovascular disorders: Secondary | ICD-10-CM | POA: Diagnosis not present

## 2018-05-22 DIAGNOSIS — Z0000 Encounter for general adult medical examination without abnormal findings: Secondary | ICD-10-CM | POA: Diagnosis not present

## 2018-05-22 DIAGNOSIS — D510 Vitamin B12 deficiency anemia due to intrinsic factor deficiency: Secondary | ICD-10-CM | POA: Diagnosis not present

## 2018-05-22 DIAGNOSIS — Z9181 History of falling: Secondary | ICD-10-CM | POA: Diagnosis not present

## 2018-05-26 DIAGNOSIS — M25562 Pain in left knee: Secondary | ICD-10-CM | POA: Diagnosis not present

## 2018-05-26 DIAGNOSIS — M25511 Pain in right shoulder: Secondary | ICD-10-CM | POA: Diagnosis not present

## 2018-05-26 DIAGNOSIS — M19011 Primary osteoarthritis, right shoulder: Secondary | ICD-10-CM | POA: Diagnosis not present

## 2018-05-26 DIAGNOSIS — M1712 Unilateral primary osteoarthritis, left knee: Secondary | ICD-10-CM | POA: Diagnosis not present

## 2018-05-26 DIAGNOSIS — M170 Bilateral primary osteoarthritis of knee: Secondary | ICD-10-CM | POA: Diagnosis not present

## 2018-05-27 DIAGNOSIS — M25561 Pain in right knee: Secondary | ICD-10-CM | POA: Diagnosis not present

## 2018-05-27 DIAGNOSIS — M1711 Unilateral primary osteoarthritis, right knee: Secondary | ICD-10-CM | POA: Diagnosis not present

## 2018-05-27 DIAGNOSIS — M19011 Primary osteoarthritis, right shoulder: Secondary | ICD-10-CM | POA: Diagnosis not present

## 2018-05-27 DIAGNOSIS — M25511 Pain in right shoulder: Secondary | ICD-10-CM | POA: Diagnosis not present

## 2018-06-02 DIAGNOSIS — M25562 Pain in left knee: Secondary | ICD-10-CM | POA: Diagnosis not present

## 2018-06-02 DIAGNOSIS — M1712 Unilateral primary osteoarthritis, left knee: Secondary | ICD-10-CM | POA: Diagnosis not present

## 2018-06-04 DIAGNOSIS — M1711 Unilateral primary osteoarthritis, right knee: Secondary | ICD-10-CM | POA: Diagnosis not present

## 2018-06-04 DIAGNOSIS — M25561 Pain in right knee: Secondary | ICD-10-CM | POA: Diagnosis not present

## 2018-06-10 DIAGNOSIS — M25562 Pain in left knee: Secondary | ICD-10-CM | POA: Diagnosis not present

## 2018-06-10 DIAGNOSIS — M1712 Unilateral primary osteoarthritis, left knee: Secondary | ICD-10-CM | POA: Diagnosis not present

## 2018-06-11 DIAGNOSIS — M1711 Unilateral primary osteoarthritis, right knee: Secondary | ICD-10-CM | POA: Diagnosis not present

## 2018-06-11 DIAGNOSIS — M25561 Pain in right knee: Secondary | ICD-10-CM | POA: Diagnosis not present

## 2018-06-13 DIAGNOSIS — D510 Vitamin B12 deficiency anemia due to intrinsic factor deficiency: Secondary | ICD-10-CM | POA: Diagnosis not present

## 2018-06-17 DIAGNOSIS — M1712 Unilateral primary osteoarthritis, left knee: Secondary | ICD-10-CM | POA: Diagnosis not present

## 2018-06-17 DIAGNOSIS — M25562 Pain in left knee: Secondary | ICD-10-CM | POA: Diagnosis not present

## 2018-06-18 DIAGNOSIS — M25561 Pain in right knee: Secondary | ICD-10-CM | POA: Diagnosis not present

## 2018-06-18 DIAGNOSIS — M1711 Unilateral primary osteoarthritis, right knee: Secondary | ICD-10-CM | POA: Diagnosis not present

## 2018-06-19 ENCOUNTER — Other Ambulatory Visit: Payer: Self-pay

## 2018-06-30 DIAGNOSIS — M1712 Unilateral primary osteoarthritis, left knee: Secondary | ICD-10-CM | POA: Diagnosis not present

## 2018-06-30 DIAGNOSIS — M25562 Pain in left knee: Secondary | ICD-10-CM | POA: Diagnosis not present

## 2018-07-11 DIAGNOSIS — M546 Pain in thoracic spine: Secondary | ICD-10-CM | POA: Diagnosis not present

## 2018-07-11 DIAGNOSIS — M545 Low back pain: Secondary | ICD-10-CM | POA: Diagnosis not present

## 2018-07-11 DIAGNOSIS — Z681 Body mass index (BMI) 19 or less, adult: Secondary | ICD-10-CM | POA: Diagnosis not present

## 2018-07-14 DIAGNOSIS — D510 Vitamin B12 deficiency anemia due to intrinsic factor deficiency: Secondary | ICD-10-CM | POA: Diagnosis not present

## 2018-07-14 DIAGNOSIS — M47815 Spondylosis without myelopathy or radiculopathy, thoracolumbar region: Secondary | ICD-10-CM | POA: Diagnosis not present

## 2018-07-14 DIAGNOSIS — M47814 Spondylosis without myelopathy or radiculopathy, thoracic region: Secondary | ICD-10-CM | POA: Diagnosis not present

## 2018-07-14 DIAGNOSIS — M4185 Other forms of scoliosis, thoracolumbar region: Secondary | ICD-10-CM | POA: Diagnosis not present

## 2018-07-14 DIAGNOSIS — M545 Low back pain: Secondary | ICD-10-CM | POA: Diagnosis not present

## 2018-07-14 DIAGNOSIS — M546 Pain in thoracic spine: Secondary | ICD-10-CM | POA: Diagnosis not present

## 2018-07-17 DIAGNOSIS — M549 Dorsalgia, unspecified: Secondary | ICD-10-CM | POA: Diagnosis not present

## 2018-07-17 DIAGNOSIS — M4605 Spinal enthesopathy, thoracolumbar region: Secondary | ICD-10-CM | POA: Diagnosis not present

## 2018-07-21 DIAGNOSIS — M9902 Segmental and somatic dysfunction of thoracic region: Secondary | ICD-10-CM | POA: Diagnosis not present

## 2018-07-21 DIAGNOSIS — M546 Pain in thoracic spine: Secondary | ICD-10-CM | POA: Diagnosis not present

## 2018-08-11 DIAGNOSIS — M546 Pain in thoracic spine: Secondary | ICD-10-CM | POA: Diagnosis not present

## 2018-08-11 DIAGNOSIS — M9902 Segmental and somatic dysfunction of thoracic region: Secondary | ICD-10-CM | POA: Diagnosis not present

## 2018-08-13 DIAGNOSIS — M546 Pain in thoracic spine: Secondary | ICD-10-CM | POA: Diagnosis not present

## 2018-08-13 DIAGNOSIS — M9902 Segmental and somatic dysfunction of thoracic region: Secondary | ICD-10-CM | POA: Diagnosis not present

## 2018-08-18 DIAGNOSIS — M549 Dorsalgia, unspecified: Secondary | ICD-10-CM | POA: Diagnosis not present

## 2018-08-20 DIAGNOSIS — M546 Pain in thoracic spine: Secondary | ICD-10-CM | POA: Diagnosis not present

## 2018-08-20 DIAGNOSIS — M9902 Segmental and somatic dysfunction of thoracic region: Secondary | ICD-10-CM | POA: Diagnosis not present

## 2018-08-25 DIAGNOSIS — M545 Low back pain: Secondary | ICD-10-CM | POA: Diagnosis not present

## 2018-08-25 DIAGNOSIS — M47816 Spondylosis without myelopathy or radiculopathy, lumbar region: Secondary | ICD-10-CM | POA: Diagnosis not present

## 2018-08-25 DIAGNOSIS — M546 Pain in thoracic spine: Secondary | ICD-10-CM | POA: Diagnosis not present

## 2018-08-25 DIAGNOSIS — M5126 Other intervertebral disc displacement, lumbar region: Secondary | ICD-10-CM | POA: Diagnosis not present

## 2018-08-25 DIAGNOSIS — S22060D Wedge compression fracture of T7-T8 vertebra, subsequent encounter for fracture with routine healing: Secondary | ICD-10-CM | POA: Diagnosis not present

## 2018-08-28 DIAGNOSIS — S22000S Wedge compression fracture of unspecified thoracic vertebra, sequela: Secondary | ICD-10-CM | POA: Diagnosis not present

## 2018-08-28 DIAGNOSIS — S32050A Wedge compression fracture of fifth lumbar vertebra, initial encounter for closed fracture: Secondary | ICD-10-CM | POA: Diagnosis not present

## 2018-08-28 DIAGNOSIS — M545 Low back pain: Secondary | ICD-10-CM | POA: Diagnosis not present

## 2018-08-28 DIAGNOSIS — E538 Deficiency of other specified B group vitamins: Secondary | ICD-10-CM | POA: Diagnosis not present

## 2018-09-01 DIAGNOSIS — S22000A Wedge compression fracture of unspecified thoracic vertebra, initial encounter for closed fracture: Secondary | ICD-10-CM | POA: Diagnosis not present

## 2018-09-01 DIAGNOSIS — M545 Low back pain: Secondary | ICD-10-CM | POA: Diagnosis not present

## 2018-09-03 ENCOUNTER — Other Ambulatory Visit: Payer: Self-pay | Admitting: Neurosurgery

## 2018-09-08 ENCOUNTER — Encounter (HOSPITAL_COMMUNITY): Payer: Self-pay | Admitting: *Deleted

## 2018-09-08 ENCOUNTER — Other Ambulatory Visit: Payer: Self-pay

## 2018-09-08 NOTE — Progress Notes (Signed)
Pt denies SOB, chest pain, and being under the care of a cardiologist. Pt denies having a stress test, echo and cardiac cath. Pt denies having an EKG and chest x ray within the last year. Pt denies recent labs. Pt made aware to stop taking Aspirin, vitamins, fish oil and herbal medications (Lung otc supplement). Do not take any NSAIDs ie: Ibuprofen, Advil, Naproxen (Aleve), Motrin, BC and Goody Powder. Pt verbalized understanding of all pre-op instructions.

## 2018-09-08 NOTE — H&P (Signed)
Chief Complaint     Thoracolumbar back pain  HPI   HPI: Jasmin Anderson is a 83 y.o. female with thoracolumbar back pain after lifting a 5 gallon gas tank on July 04, 2018. Pain is localized to the midline thoracic and lumbar spine.  She denies radiation into her abdomen her into her lower extremities.  She underwent an MRI of her thoracic spine revealing compression fractures at T7, T8 & T9.  She presents today for thoracic kyphoplasty.  She is without any concerns.   There are no active problems to display for this patient.   PMH: Past Medical History:  Diagnosis Date  . Anxiety    situational  . Arthritis   . Bronchiectasis ([place])   . Compression fracture of body of thoracic vertebra (HCC)   . Esophageal tear    during hiatal hernia surgery  . GERD (gastroesophageal reflux disease)   . Pneumonia   . Wears glasses   . Wears partial dentures     PSH: Past Surgical History:  Procedure Laterality Date  . APPENDECTOMY    . CATARACT EXTRACTION W/ INTRAOCULAR LENS  IMPLANT, BILATERAL    . CESAREAN SECTION     x 2  . CHOLECYSTECTOMY    . COLONOSCOPY    . FOOT NEUROMA SURGERY     x 2  . HERNIA REPAIR     hiatal hernia  . MULTIPLE TOOTH EXTRACTIONS    . TUBAL LIGATION      No medications prior to admission.    SH: Social History   Tobacco Use  . Smoking status: Never Smoker  . Smokeless tobacco: Never Used  Substance Use Topics  . Alcohol use: Yes    Comment: rare  . Drug use: Never    MEDS: Prior to Admission medications   Medication Sig Start Date End Date Taking? Authorizing Provider  acetaminophen (TYLENOL) 650 MG CR tablet Take 1,300 mg by mouth 2 (two) times daily.   Yes [provider]  ALPRAZolam (XANAX) 0.25 MG tablet Take 0.25 mg by mouth daily as needed for anxiety. 07/15/18  Yes [provider]  Cyanocobalamin (B-12 COMPLIANCE INJECTION IJ) Inject 1 Dose as directed every 30 (thirty) days.   Yes [provider]    esomeprazole (NEXIUM) 40 MG capsule Take 40 mg by mouth daily.    Yes [provider]  OVER THE COUNTER MEDICATION Take 2 tablets by mouth daily. Clear lung otc supplement   Yes [provider]  oxyCODONE (OXY IR/ROXICODONE) 5 MG immediate release tablet Take 5 mg by mouth every 6 (six) hours as needed for pain. 08/26/18  Yes [provider]    ALLERGY: Allergies  Allergen Reactions  . Gabapentin Swelling    Foot swelling  . Codeine Palpitations    Social History   Tobacco Use  . Smoking status: Never Smoker  . Smokeless tobacco: Never Used  Substance Use Topics  . Alcohol use: Yes    Comment: rare     History reviewed. No pertinent family history.   ROS   ROS  Exam   There were no vitals filed for this visit. General appearance: WDWN, NAD Eyes: No scleral injection Cardiovascular: Regular rate and rhythm without murmurs, rubs, gallops. No edema or variciosities. Distal pulses normal. Pulmonary: Effort normal, non-labored breathing Musculoskeletal:     Muscle tone upper extremities: Normal    Muscle tone lower extremities: Normal    Motor exam: Upper Extremities Deltoid Bicep Tricep Grip  Right 5/5  5/5 5/5 5/5  Left 5/5 5/5 5/5 5/5   Lower Extremity IP Quad PF DF EHL  Right 5/5 5/5 5/5 5/5 5/5  Left 5/5 5/5 5/5 5/5 5/5   Neurological Mental Status:    - Patient is awake, alert, oriented to person, place, month, year, and situation    - Patient is able to give a clear and coherent history.    - No signs of aphasia or neglect Cranial Nerves    - II: Visual Fields are full. PERRL    - III/IV/VI: EOMI without ptosis or diploplia.     - V: Facial sensation is grossly normal    - VII: Facial movement is symmetric.     - VIII: hearing is intact to voice    - X: Uvula elevates symmetrically    - XI: Shoulder shrug is symmetric.    - XII: tongue is midline without atrophy or fasciculations.  Sensory: Sensation grossly intact to  LT  Results - Imaging/Labs   No results found for this or any previous visit (from the past 48 hour(s)).  No results found.  IMAGING: MRI of the thoracic spine dated 08/25/2018 was reviewed.  This demonstrates subacute appearing compression fractures at T7, T8, and T9, without any bony retropulsion.  Also noted is significant thoracic kyphosis.  MRI of the lumbar spine of the same date was also reviewed.  This again demonstrates minor endplate fractures at L5 and S1, without significant loss of height.  Impression/Plan   82 y.o. female with midthoracic back pain likely related to subacute compression fractures at T7-T8 and T9.  She has not had improvement despite 8 weeks of reasonable conservative treatment.  With kyphoplasty at T7, T8 and T9.  Risks of the procedure were reviewed including risk of cement migration causing nerve or spinal cord compression, cement pulmonary embolus, bleeding, infection.  We also discussed the possibility of continued or worsening pain symptoms.   Risk of future compression fractures was discussed.  All questions were answered, and the patient is willing to proceed with the vertebral augmentation.

## 2018-09-09 ENCOUNTER — Encounter (HOSPITAL_COMMUNITY)
Admission: RE | Disposition: A | Discharge status: Home / Self Care | Payer: Self-pay | Source: Home / Self Care | Attending: Neurosurgery

## 2018-09-09 ENCOUNTER — Ambulatory Visit (HOSPITAL_COMMUNITY)
Admission: RE | Admit: 2018-09-09 | Discharge: 2018-09-09 | Disposition: A | Discharge status: Home / Self Care | Payer: Medicare Other | Attending: Neurosurgery | Admitting: Neurosurgery

## 2018-09-09 ENCOUNTER — Encounter (HOSPITAL_COMMUNITY): Payer: Self-pay

## 2018-09-09 ENCOUNTER — Ambulatory Visit (HOSPITAL_COMMUNITY): Payer: Medicare Other | Admitting: Anesthesiology

## 2018-09-09 ENCOUNTER — Ambulatory Visit (HOSPITAL_COMMUNITY): Payer: Medicare Other

## 2018-09-09 DIAGNOSIS — K219 Gastro-esophageal reflux disease without esophagitis: Secondary | ICD-10-CM | POA: Diagnosis not present

## 2018-09-09 DIAGNOSIS — S22000A Wedge compression fracture of unspecified thoracic vertebra, initial encounter for closed fracture: Secondary | ICD-10-CM

## 2018-09-09 DIAGNOSIS — F419 Anxiety disorder, unspecified: Secondary | ICD-10-CM | POA: Diagnosis not present

## 2018-09-09 DIAGNOSIS — S22060A Wedge compression fracture of T7-T8 vertebra, initial encounter for closed fracture: Secondary | ICD-10-CM | POA: Diagnosis not present

## 2018-09-09 DIAGNOSIS — X500XXA Overexertion from strenuous movement or load, initial encounter: Secondary | ICD-10-CM | POA: Insufficient documentation

## 2018-09-09 DIAGNOSIS — Z79899 Other long term (current) drug therapy: Secondary | ICD-10-CM | POA: Insufficient documentation

## 2018-09-09 DIAGNOSIS — S22069A Unspecified fracture of T7-T8 vertebra, initial encounter for closed fracture: Secondary | ICD-10-CM | POA: Insufficient documentation

## 2018-09-09 DIAGNOSIS — M546 Pain in thoracic spine: Secondary | ICD-10-CM | POA: Diagnosis present

## 2018-09-09 DIAGNOSIS — Z981 Arthrodesis status: Secondary | ICD-10-CM | POA: Diagnosis not present

## 2018-09-09 DIAGNOSIS — S22079A Unspecified fracture of T9-T10 vertebra, initial encounter for closed fracture: Secondary | ICD-10-CM | POA: Diagnosis not present

## 2018-09-09 DIAGNOSIS — S22070A Wedge compression fracture of T9-T10 vertebra, initial encounter for closed fracture: Secondary | ICD-10-CM | POA: Diagnosis not present

## 2018-09-09 DIAGNOSIS — M4854XA Collapsed vertebra, not elsewhere classified, thoracic region, initial encounter for fracture: Secondary | ICD-10-CM | POA: Diagnosis not present

## 2018-09-09 HISTORY — DX: Laceration without foreign body of pharynx and cervical esophagus, initial encounter: S11.21XA

## 2018-09-09 HISTORY — DX: Gastro-esophageal reflux disease without esophagitis: K21.9

## 2018-09-09 HISTORY — DX: Bronchiectasis, uncomplicated: J47.9

## 2018-09-09 HISTORY — DX: Unspecified osteoarthritis, unspecified site: M19.90

## 2018-09-09 HISTORY — DX: Presence of spectacles and contact lenses: Z97.3

## 2018-09-09 HISTORY — PX: KYPHOPLASTY: SHX5884

## 2018-09-09 HISTORY — DX: Presence of dental prosthetic device (complete) (partial): Z97.2

## 2018-09-09 HISTORY — DX: Wedge compression fracture of unspecified thoracic vertebra, initial encounter for closed fracture: S22.000A

## 2018-09-09 HISTORY — DX: Pneumonia, unspecified organism: J18.9

## 2018-09-09 HISTORY — DX: Anxiety disorder, unspecified: F41.9

## 2018-09-09 LAB — CBC
HCT: 36.5 % (ref 36.0–46.0)
Hemoglobin: 11.4 g/dL — ABNORMAL LOW (ref 12.0–15.0)
MCH: 29.4 pg (ref 26.0–34.0)
MCHC: 31.2 g/dL (ref 30.0–36.0)
MCV: 94.1 fL (ref 80.0–100.0)
PLATELETS: 490 10*3/uL — AB (ref 150–400)
RBC: 3.88 MIL/uL (ref 3.87–5.11)
RDW: 14.4 % (ref 11.5–15.5)
WBC: 10.6 10*3/uL — ABNORMAL HIGH (ref 4.0–10.5)
nRBC: 0 % (ref 0.0–0.2)

## 2018-09-09 LAB — BASIC METABOLIC PANEL
Anion gap: 12 (ref 5–15)
BUN: 11 mg/dL (ref 8–23)
CO2: 30 mmol/L (ref 22–32)
Calcium: 9.3 mg/dL (ref 8.9–10.3)
Chloride: 99 mmol/L (ref 98–111)
Creatinine, Ser: 0.86 mg/dL (ref 0.44–1.00)
GFR calc Af Amer: >60 mL/min (ref >60)
GFR calc non Af Amer: >60 mL/min (ref >60)
Glucose, Bld: 102 mg/dL — ABNORMAL HIGH (ref 70–99)
Potassium: 3.5 mmol/L (ref 3.5–5.1)
Sodium: 141 mmol/L (ref 135–145)

## 2018-09-09 SURGERY — Surgical Case
Anesthesia: *Unknown

## 2018-09-09 SURGERY — KYPHOPLASTY
Anesthesia: General | Laterality: Bilateral

## 2018-09-09 MED ORDER — LIDOCAINE 2% (20 MG/ML) 5 ML SYRINGE
INTRAMUSCULAR | Status: AC
  Filled 2018-09-09: qty 5

## 2018-09-09 MED ORDER — PROPOFOL 10 MG/ML IV BOLUS
INTRAVENOUS | Status: DC | PRN
  Administered 2018-09-09: 100 mg via INTRAVENOUS

## 2018-09-09 MED ORDER — LIDOCAINE 2% (20 MG/ML) 5 ML SYRINGE
INTRAMUSCULAR | Status: DC | PRN
  Administered 2018-09-09: 60 mg via INTRAVENOUS

## 2018-09-09 MED ORDER — FENTANYL CITRATE (PF) 250 MCG/5ML IJ SOLN
INTRAMUSCULAR | Status: AC
  Filled 2018-09-09: qty 5

## 2018-09-09 MED ORDER — LACTATED RINGERS IV SOLN
INTRAVENOUS | Status: DC
  Administered 2018-09-09 (×2): via INTRAVENOUS

## 2018-09-09 MED ORDER — LIDOCAINE-EPINEPHRINE 1 %-1:100000 IJ SOLN
INTRAMUSCULAR | Status: AC
  Filled 2018-09-09: qty 1

## 2018-09-09 MED ORDER — DEXAMETHASONE SODIUM PHOSPHATE 10 MG/ML IJ SOLN
INTRAMUSCULAR | Status: DC | PRN
  Administered 2018-09-09: 10 mg via INTRAVENOUS

## 2018-09-09 MED ORDER — BUPIVACAINE HCL (PF) 0.5 % IJ SOLN
INTRAMUSCULAR | Status: AC
  Filled 2018-09-09: qty 30

## 2018-09-09 MED ORDER — CEFAZOLIN SODIUM-DEXTROSE 2-4 GM/100ML-% IV SOLN
INTRAVENOUS | Status: AC
  Filled 2018-09-09: qty 100

## 2018-09-09 MED ORDER — FENTANYL CITRATE (PF) 100 MCG/2ML IJ SOLN
INTRAMUSCULAR | Status: DC | PRN
  Administered 2018-09-09 (×2): 50 ug via INTRAVENOUS
  Administered 2018-09-09: 25 ug via INTRAVENOUS

## 2018-09-09 MED ORDER — ONDANSETRON HCL 4 MG/2ML IJ SOLN
INTRAMUSCULAR | Status: AC
  Filled 2018-09-09: qty 2

## 2018-09-09 MED ORDER — LIDOCAINE-EPINEPHRINE 1 %-1:100000 IJ SOLN
INTRAMUSCULAR | Status: DC | PRN
  Administered 2018-09-09: 5 mL

## 2018-09-09 MED ORDER — CHLORHEXIDINE GLUCONATE CLOTH 2 % EX PADS: 6.0000 | MEDICATED_PAD | Freq: Once | CUTANEOUS | Status: DC

## 2018-09-09 MED ORDER — 0.9 % SODIUM CHLORIDE (POUR BTL) OPTIME
TOPICAL | Status: DC | PRN
  Administered 2018-09-09: 1000 mL

## 2018-09-09 MED ORDER — DEXAMETHASONE SODIUM PHOSPHATE 10 MG/ML IJ SOLN
INTRAMUSCULAR | Status: AC
  Filled 2018-09-09: qty 1

## 2018-09-09 MED ORDER — IOPAMIDOL (ISOVUE-300) INJECTION 61%
INTRAVENOUS | Status: AC
  Filled 2018-09-09: qty 50

## 2018-09-09 MED ORDER — ACETAMINOPHEN 500 MG PO TABS
1000.0000 mg | ORAL_TABLET | Freq: Once | ORAL | Status: AC
  Administered 2018-09-09: 1000 mg via ORAL

## 2018-09-09 MED ORDER — ONDANSETRON HCL 4 MG/2ML IJ SOLN
INTRAMUSCULAR | Status: DC | PRN
  Administered 2018-09-09: 4 mg via INTRAVENOUS

## 2018-09-09 MED ORDER — CEFAZOLIN SODIUM-DEXTROSE 2-4 GM/100ML-% IV SOLN
2.0000 g | INTRAVENOUS | Status: AC
  Administered 2018-09-09: 2 g via INTRAVENOUS

## 2018-09-09 MED ORDER — BUPIVACAINE HCL (PF) 0.5 % IJ SOLN
INTRAMUSCULAR | Status: DC | PRN
  Administered 2018-09-09: 5 mL

## 2018-09-09 MED ORDER — ROCURONIUM BROMIDE 50 MG/5ML IV SOSY
PREFILLED_SYRINGE | INTRAVENOUS | Status: DC | PRN
  Administered 2018-09-09: 40 mg via INTRAVENOUS

## 2018-09-09 MED ORDER — ROCURONIUM BROMIDE 50 MG/5ML IV SOSY
PREFILLED_SYRINGE | INTRAVENOUS | Status: AC
  Filled 2018-09-09: qty 5

## 2018-09-09 MED ORDER — ACETAMINOPHEN 500 MG PO TABS
ORAL_TABLET | ORAL | Status: AC
  Administered 2018-09-09: 1000 mg via ORAL
  Filled 2018-09-09: qty 2

## 2018-09-09 MED ORDER — SUGAMMADEX SODIUM 200 MG/2ML IV SOLN
INTRAVENOUS | Status: DC | PRN
  Administered 2018-09-09: 200 mg via INTRAVENOUS

## 2018-09-09 MED ORDER — IOPAMIDOL (ISOVUE-300) INJECTION 61%
INTRAVENOUS | Status: DC | PRN
  Administered 2018-09-09 (×2): 50 mL

## 2018-09-09 SURGICAL SUPPLY — 41 items
BLADE CLIPPER SURG (BLADE) IMPLANT
BLADE SURG 15 STRL LF DISP TIS (BLADE) ×1 IMPLANT
BLADE SURG 15 STRL SS (BLADE) ×2
CEMENT KYPHON C01A KIT/MIXER (Cement) ×6 IMPLANT
COVER WAND RF STERILE (DRAPES) ×3 IMPLANT
DERMABOND ADVANCED (GAUZE/BANDAGES/DRESSINGS) ×2
DERMABOND ADVANCED .7 DNX12 (GAUZE/BANDAGES/DRESSINGS) ×1 IMPLANT
DRAPE C-ARM 42X72 X-RAY (DRAPES) ×3 IMPLANT
DRAPE C-ARMOR (DRAPES) ×3 IMPLANT
DRAPE HALF SHEET 40X57 (DRAPES) ×3 IMPLANT
DRAPE INCISE IOBAN 66X45 STRL (DRAPES) ×6 IMPLANT
DRAPE LAPAROTOMY 100X72X124 (DRAPES) ×6 IMPLANT
DRAPE SURG 17X23 STRL (DRAPES) ×3 IMPLANT
DRAPE WARM FLUID 44X44 (DRAPE) ×3 IMPLANT
DURAPREP 26ML APPLICATOR (WOUND CARE) ×3 IMPLANT
GAUZE 4X4 16PLY RFD (DISPOSABLE) ×3 IMPLANT
GLOVE BIO SURGEON STRL SZ7 (GLOVE) IMPLANT
GLOVE BIOGEL PI IND STRL 7.0 (GLOVE) IMPLANT
GLOVE BIOGEL PI IND STRL 7.5 (GLOVE) ×1 IMPLANT
GLOVE BIOGEL PI INDICATOR 7.0 (GLOVE)
GLOVE BIOGEL PI INDICATOR 7.5 (GLOVE) ×2
GLOVE ECLIPSE 7.0 STRL STRAW (GLOVE) ×3 IMPLANT
GLOVE EXAM NITRILE XL STR (GLOVE) IMPLANT
GOWN STRL REUS W/ TWL LRG LVL3 (GOWN DISPOSABLE) ×5 IMPLANT
GOWN STRL REUS W/ TWL XL LVL3 (GOWN DISPOSABLE) IMPLANT
GOWN STRL REUS W/TWL 2XL LVL3 (GOWN DISPOSABLE) IMPLANT
GOWN STRL REUS W/TWL LRG LVL3 (GOWN DISPOSABLE) ×10
GOWN STRL REUS W/TWL XL LVL3 (GOWN DISPOSABLE)
KIT BASIN OR (CUSTOM PROCEDURE TRAY) ×3 IMPLANT
KIT TURNOVER KIT B (KITS) ×3 IMPLANT
NEEDLE HYPO 25X1 1.5 SAFETY (NEEDLE) ×3 IMPLANT
NEEDLE SPNL 22GX3.5 QUINCKE BK (NEEDLE) ×6 IMPLANT
NS IRRIG 1000ML POUR BTL (IV SOLUTION) ×3 IMPLANT
PACK SURGICAL SETUP 50X90 (CUSTOM PROCEDURE TRAY) ×3 IMPLANT
PAD ARMBOARD 7.5X6 YLW CONV (MISCELLANEOUS) ×9 IMPLANT
SPECIMEN JAR SMALL (MISCELLANEOUS) IMPLANT
SUT VICRYL 3-0 RB1 18 ABS (SUTURE) ×3 IMPLANT
SYR CONTROL 10ML LL (SYRINGE) ×6 IMPLANT
TOWEL GREEN STERILE (TOWEL DISPOSABLE) ×3 IMPLANT
TOWEL GREEN STERILE FF (TOWEL DISPOSABLE) ×3 IMPLANT
TRAY KYPHOPAK 15/3 ONESTEP 1ST (MISCELLANEOUS) ×6 IMPLANT

## 2018-09-09 NOTE — Transfer of Care (Signed)
Immediate Anesthesia Transfer of Care Note  Patient: Jasmin Anderson  Procedure(s) Performed: Kyphoplasty - T7 - T8 - T9 (Bilateral )  Patient Location: PACU  Anesthesia Type:General  Level of Consciousness: awake, alert  and oriented  Airway & Oxygen Therapy: Patient Spontanous Breathing and Patient connected to face mask oxygen  Post-op Assessment: Report given to RN and Post -op Vital signs reviewed and stable  Post vital signs: Reviewed and stable  Last Vitals:  Vitals Value Taken Time  BP 132/58   Temp    Pulse 92 09/09/2018 12:27 PM  Resp 12   SpO2 100 % 09/09/2018 12:27 PM  Vitals shown include unvalidated device data.  Last Pain:  Vitals:   09/09/18 0850  PainSc: 0-No pain      Patients Stated Pain Goal: 4 (16/10/96 0454)  Complications: No apparent anesthesia complications

## 2018-09-09 NOTE — Anesthesia Preprocedure Evaluation (Addendum)
Anesthesia Evaluation  Patient identified by MRN, date of birth, ID band Patient awake    Reviewed: Allergy & Precautions, H&P , NPO status , Patient's Chart, lab work & pertinent test results  Airway Mallampati: II  TM Distance: >3 FB Neck ROM: Full    Dental no notable dental hx. (+) Teeth Intact, Dental Advisory Given   Pulmonary neg pulmonary ROS,    Pulmonary exam normal breath sounds clear to auscultation       Cardiovascular negative cardio ROS   Rhythm:Regular Rate:Normal     Neuro/Psych Anxiety negative neurological ROS     GI/Hepatic Neg liver ROS, GERD  Medicated and Controlled,  Endo/Other  negative endocrine ROS  Renal/GU negative Renal ROS  negative genitourinary   Musculoskeletal  (+) Arthritis , Osteoarthritis,    Abdominal   Peds  Hematology negative hematology ROS (+)   Anesthesia Other Findings   Reproductive/Obstetrics negative OB ROS                            Anesthesia Physical Anesthesia Plan  ASA: II  Anesthesia Plan: General   Post-op Pain Management:    Induction: Intravenous  PONV Risk Score and Plan: 4 or greater and Ondansetron, Dexamethasone and Treatment may vary due to age or medical condition  Airway Management Planned: Oral ETT  Additional Equipment:   Intra-op Plan:   Post-operative Plan: Extubation in OR  Informed Consent: I have reviewed the patients History and Physical, chart, labs and discussed the procedure including the risks, benefits and alternatives for the proposed anesthesia with the patient or authorized representative who has indicated his/her understanding and acceptance.     Dental advisory given  Plan Discussed with: CRNA  Anesthesia Plan Comments:         Anesthesia Quick Evaluation

## 2018-09-09 NOTE — Discharge Summary (Signed)
  Physician Discharge Summary  Patient ID: Jasmin Anderson MRN: [id number] DOB/AGE: 03/07/1936 83 y.o.  Admit date: 09/09/2018 Discharge date: 09/09/2018  Admission Diagnoses:  Thoracic compression fracture, T7-T9  Discharge Diagnoses:  Same Active Problems:   * No active hospital problems. *   Discharged Condition: Stable  Hospital Course:  Jasmin Anderson is a 83 y.o. female who underwent elective T7-9 kyphoplasty. She was at baseline postop.  Treatments: Surgery - T7-T9 kyphoplasty  Discharge Exam: Blood pressure (!) 142/62, pulse 77, temperature 98.4 F (36.9 C), resp. rate 16, height 5\' 3"  (1.6 m), weight 44.9 kg, SpO2 98 %. Awake, alert, oriented Speech fluent, appropriate CN grossly intact 5/5 BUE/BLE Wound c/d/i  Disposition: Discharge disposition: 01-Home or Self Care       Discharge Instructions    Call MD for:  redness, tenderness, or signs of infection (pain, swelling, redness, odor or green/yellow discharge around incision site)   Complete by:  As directed    Call MD for:  temperature >100.4   Complete by:  As directed    Diet - low sodium heart healthy   Complete by:  As directed    Discharge instructions   Complete by:  As directed    Walk at home as much as possible, at least 4 times / day   Increase activity slowly   Complete by:  As directed    Lifting restrictions   Complete by:  As directed    No lifting > 10 lbs   May shower / Bathe   Complete by:  As directed    48 hours after surgery   May walk up steps   Complete by:  As directed    No dressing needed   Complete by:  As directed    Other Restrictions   Complete by:  As directed    No bending/twisting at waist     Allergies as of 09/09/2018      Reactions   Codeine Palpitations   Gabapentin Swelling   Foot swelling      Medication List    TAKE these medications   acetaminophen 650 MG CR tablet Commonly known as:  TYLENOL Take 1,300 mg by mouth 2 (two) times daily.   ALPRAZolam  0.25 MG tablet Commonly known as:  XANAX Take 0.25 mg by mouth daily as needed for anxiety.   B-12 COMPLIANCE INJECTION IJ Inject 1 Dose as directed every 30 (thirty) days.   esomeprazole 40 MG capsule Commonly known as:  NEXIUM Take 40 mg by mouth daily.   OVER THE COUNTER MEDICATION Take 2 tablets by mouth daily. Clear lung otc supplement   oxyCODONE 5 MG immediate release tablet Commonly known as:  Oxy IR/ROXICODONE Take 5 mg by mouth every 6 (six) hours as needed for pain.      Follow-up Information    Consuella Lose, MD In 2 weeks.   Specialty:  Neurosurgery Contact information: [address] [place] [address] [phone]           Signed: Jairo Ben 09/09/2018, 12:23 PM

## 2018-09-09 NOTE — Anesthesia Postprocedure Evaluation (Signed)
Anesthesia Post Note  Patient: Deetra Booton  Procedure(s) Performed: Kyphoplasty - T7 - T8 - T9 (Bilateral )     Patient location during evaluation: PACU Anesthesia Type: General Level of consciousness: awake and alert Pain management: pain level controlled Vital Signs Assessment: post-procedure vital signs reviewed and stable Respiratory status: spontaneous breathing, nonlabored ventilation and respiratory function stable Cardiovascular status: blood pressure returned to baseline and stable Postop Assessment: no apparent nausea or vomiting Anesthetic complications: no    Last Vitals:  Vitals:   09/09/18 1343 09/09/18 1345  BP: (!) 113/54   Pulse: 72 72  Resp: 11 14  Temp: (!) 36.3 C   SpO2: 97% 97%    Last Pain:  Vitals:   09/09/18 1400  PainSc: 0-No pain      LLE Sensation: Full sensation (09/09/18 1400)   RLE Sensation: Full sensation (09/09/18 1400)      Tadeo Besecker,W. EDMOND

## 2018-09-09 NOTE — Anesthesia Procedure Notes (Signed)
Procedure Name: Intubation Date/Time: 09/09/2018 10:18 AM Performed by: Genelle Bal, CRNA Pre-anesthesia Checklist: Patient identified, Emergency Drugs available, Suction available and Patient being monitored Patient Re-evaluated:Patient Re-evaluated prior to induction Oxygen Delivery Method: Circle system utilized Preoxygenation: Pre-oxygenation with 100% oxygen Induction Type: IV induction Ventilation: Mask ventilation without difficulty Laryngoscope Size: Mac and 3 Grade View: Grade I Tube type: Oral Tube size: 7.0 mm Number of attempts: 1 Airway Equipment and Method: Stylet and Oral airway Placement Confirmation: ETT inserted through vocal cords under direct vision,  positive ETCO2 and breath sounds checked- equal and bilateral Secured at: 20 cm Tube secured with: Tape Dental Injury: Teeth and Oropharynx as per pre-operative assessment

## 2018-09-09 NOTE — Op Note (Signed)
  NEUROSURGERY OPERATIVE NOTE   PREOP DIAGNOSIS: T7, T8, T9 compression fx   POSTOP DIAGNOSIS: Same  PROCEDURE: 1. T7, T8, T9 Kyphoplasty  SURGEON: Dr. Consuella Lose, MD  ASSISTANT: Ferne Reus, PA-C  ANESTHESIA: GETA  EBL: Minimal  SPECIMENS: None  DRAINS: None  COMPLICATIONS: None immediate  CONDITION: Hemodynamically stable to PCAU  HISTORY: Jasmin Anderson is a 83 y.o. yo female who suffered compression fractures of T7, T8, and T9 after lifting a 5 gallon jug.  She attempted reasonable conservative treatment with continued pain.  She therefore elected to proceed with kyphoplasty at the affected vertebral bodies.  Risks and benefits of the surgery were reviewed in detail with the patient and her family.  After all questions were answered informed consent was obtained and witnessed.  PROCEDURE IN DETAIL: The patient was brought to the operating room. After induction of anesthesia, the patient was positioned on the operative table in the prone position. All pressure points were meticulously padded.   After time out was conducted, using biplane fluoroscopy, attempts were made to identify the T9 vertebral body with simultaneous AP and lateral fluoroscopy.  Unfortunately, we were unable to obtain good AP and lateral views simultaneously.  We therefore elected to remove 1 of the fluoroscopy units and proceed with single plane fluoroscopy in order to obtain adequate AP and lateral images.  The pedicles at T8 and T9 were identified, and stab incision was made for access to the right T8 and left T9 pedicle. Jamshidi needles were introduced. Under AP and lateral fluoroscopic guidance, the pedicles were cannulated, and access to the vertebral body was obtained. Drill was then used to create a channel and the kyphoplasty balloon was placed and expanded to create a cavity. Approximately 3cc of PMMA cement was injected at each level under fluoroscopy, taking care to preserve the posterior  cortex. The balloons were then removed, replaced with the trocars, and the Jamshidi needles removed.  Attention was then turned to the T7 level.  In a similar fashion, bilateral T7 pedicles were cannulated, and using AP and lateral fluoroscopy access to the vertebral body was obtained.  Channel was created with the drill, and balloon was inserted and inflated.  Good cavity was created.  The balloon balloon was then deflated and removed.  Approximately 1-1/2 cc of PMMA cement was injected through each needle.  There did appear to be a small amount of cement extravasation just anterior to the vertebral body, but no posterior extravasation.  Trochars were then reintroduced, and the Jamshidi needles were removed.  Final AP and lateral fluoroscopic images confirmed good location of the cement.  Stab incisions were then closed with 3-0 vicryl suture and standard skin glue. The patient was then transferred to the stretcher and taken to the PACU in stable hemodynamic condition.  At the end of the case all sponge, needle, and instrument counts were correct.

## 2018-09-10 ENCOUNTER — Encounter (HOSPITAL_COMMUNITY): Payer: Self-pay | Admitting: Neurosurgery

## 2018-09-29 DIAGNOSIS — M810 Age-related osteoporosis without current pathological fracture: Secondary | ICD-10-CM | POA: Diagnosis not present

## 2018-09-29 DIAGNOSIS — Z681 Body mass index (BMI) 19 or less, adult: Secondary | ICD-10-CM | POA: Diagnosis not present

## 2018-09-29 DIAGNOSIS — R600 Localized edema: Secondary | ICD-10-CM | POA: Diagnosis not present

## 2018-09-29 DIAGNOSIS — D510 Vitamin B12 deficiency anemia due to intrinsic factor deficiency: Secondary | ICD-10-CM | POA: Diagnosis not present

## 2018-09-30 DIAGNOSIS — M25561 Pain in right knee: Secondary | ICD-10-CM | POA: Diagnosis not present

## 2018-09-30 DIAGNOSIS — M170 Bilateral primary osteoarthritis of knee: Secondary | ICD-10-CM | POA: Diagnosis not present

## 2018-09-30 DIAGNOSIS — M25562 Pain in left knee: Secondary | ICD-10-CM | POA: Diagnosis not present

## 2018-09-30 DIAGNOSIS — M1711 Unilateral primary osteoarthritis, right knee: Secondary | ICD-10-CM | POA: Diagnosis not present

## 2018-10-01 DIAGNOSIS — M545 Low back pain: Secondary | ICD-10-CM | POA: Diagnosis not present

## 2018-10-02 DIAGNOSIS — M4857XA Collapsed vertebra, not elsewhere classified, lumbosacral region, initial encounter for fracture: Secondary | ICD-10-CM | POA: Diagnosis not present

## 2018-10-02 DIAGNOSIS — S329XXA Fracture of unspecified parts of lumbosacral spine and pelvis, initial encounter for closed fracture: Secondary | ICD-10-CM | POA: Diagnosis not present

## 2018-10-02 DIAGNOSIS — M545 Low back pain: Secondary | ICD-10-CM | POA: Diagnosis not present

## 2018-10-02 DIAGNOSIS — Z681 Body mass index (BMI) 19 or less, adult: Secondary | ICD-10-CM | POA: Diagnosis not present

## 2018-10-02 DIAGNOSIS — S32040A Wedge compression fracture of fourth lumbar vertebra, initial encounter for closed fracture: Secondary | ICD-10-CM | POA: Diagnosis not present

## 2018-10-02 DIAGNOSIS — S32049A Unspecified fracture of fourth lumbar vertebra, initial encounter for closed fracture: Secondary | ICD-10-CM | POA: Diagnosis not present

## 2018-10-06 ENCOUNTER — Encounter: Payer: Self-pay | Admitting: Physician Assistant

## 2018-10-07 DIAGNOSIS — M1712 Unilateral primary osteoarthritis, left knee: Secondary | ICD-10-CM | POA: Diagnosis not present

## 2018-10-07 DIAGNOSIS — M25562 Pain in left knee: Secondary | ICD-10-CM | POA: Diagnosis not present

## 2018-10-27 DIAGNOSIS — M47816 Spondylosis without myelopathy or radiculopathy, lumbar region: Secondary | ICD-10-CM | POA: Diagnosis not present

## 2018-10-27 DIAGNOSIS — M545 Low back pain: Secondary | ICD-10-CM | POA: Diagnosis not present

## 2018-10-29 DIAGNOSIS — M47816 Spondylosis without myelopathy or radiculopathy, lumbar region: Secondary | ICD-10-CM | POA: Diagnosis not present

## 2018-10-31 DIAGNOSIS — M810 Age-related osteoporosis without current pathological fracture: Secondary | ICD-10-CM | POA: Diagnosis not present

## 2018-10-31 DIAGNOSIS — K5903 Drug induced constipation: Secondary | ICD-10-CM | POA: Diagnosis not present

## 2018-10-31 DIAGNOSIS — M545 Low back pain: Secondary | ICD-10-CM | POA: Diagnosis not present

## 2018-10-31 DIAGNOSIS — E538 Deficiency of other specified B group vitamins: Secondary | ICD-10-CM | POA: Diagnosis not present

## 2018-11-12 DIAGNOSIS — Z681 Body mass index (BMI) 19 or less, adult: Secondary | ICD-10-CM | POA: Diagnosis not present

## 2018-11-12 DIAGNOSIS — M47816 Spondylosis without myelopathy or radiculopathy, lumbar region: Secondary | ICD-10-CM | POA: Diagnosis not present

## 2018-11-12 DIAGNOSIS — R030 Elevated blood-pressure reading, without diagnosis of hypertension: Secondary | ICD-10-CM | POA: Diagnosis not present

## 2018-11-26 DIAGNOSIS — Z681 Body mass index (BMI) 19 or less, adult: Secondary | ICD-10-CM | POA: Diagnosis not present

## 2018-11-26 DIAGNOSIS — M47816 Spondylosis without myelopathy or radiculopathy, lumbar region: Secondary | ICD-10-CM | POA: Diagnosis not present

## 2018-11-26 DIAGNOSIS — R030 Elevated blood-pressure reading, without diagnosis of hypertension: Secondary | ICD-10-CM | POA: Diagnosis not present

## 2018-12-10 DIAGNOSIS — L03116 Cellulitis of left lower limb: Secondary | ICD-10-CM | POA: Diagnosis not present

## 2018-12-10 DIAGNOSIS — M79662 Pain in left lower leg: Secondary | ICD-10-CM | POA: Diagnosis not present

## 2018-12-10 DIAGNOSIS — D510 Vitamin B12 deficiency anemia due to intrinsic factor deficiency: Secondary | ICD-10-CM | POA: Diagnosis not present

## 2018-12-10 DIAGNOSIS — R600 Localized edema: Secondary | ICD-10-CM | POA: Diagnosis not present

## 2018-12-10 DIAGNOSIS — M79605 Pain in left leg: Secondary | ICD-10-CM | POA: Diagnosis not present

## 2018-12-16 DIAGNOSIS — M7918 Myalgia, other site: Secondary | ICD-10-CM | POA: Diagnosis not present

## 2018-12-29 DIAGNOSIS — M7918 Myalgia, other site: Secondary | ICD-10-CM | POA: Diagnosis not present

## 2018-12-29 DIAGNOSIS — M47816 Spondylosis without myelopathy or radiculopathy, lumbar region: Secondary | ICD-10-CM | POA: Diagnosis not present

## 2019-01-22 DIAGNOSIS — M7918 Myalgia, other site: Secondary | ICD-10-CM | POA: Diagnosis not present

## 2019-01-22 DIAGNOSIS — M47816 Spondylosis without myelopathy or radiculopathy, lumbar region: Secondary | ICD-10-CM | POA: Diagnosis not present

## 2019-01-22 DIAGNOSIS — M461 Sacroiliitis, not elsewhere classified: Secondary | ICD-10-CM | POA: Diagnosis not present

## 2019-01-27 DIAGNOSIS — M461 Sacroiliitis, not elsewhere classified: Secondary | ICD-10-CM | POA: Diagnosis not present

## 2019-01-27 DIAGNOSIS — R030 Elevated blood-pressure reading, without diagnosis of hypertension: Secondary | ICD-10-CM | POA: Diagnosis not present

## 2019-02-04 DIAGNOSIS — R600 Localized edema: Secondary | ICD-10-CM | POA: Diagnosis not present

## 2019-02-04 DIAGNOSIS — D510 Vitamin B12 deficiency anemia due to intrinsic factor deficiency: Secondary | ICD-10-CM | POA: Diagnosis not present

## 2019-02-04 DIAGNOSIS — R630 Anorexia: Secondary | ICD-10-CM | POA: Diagnosis not present

## 2019-02-04 DIAGNOSIS — M810 Age-related osteoporosis without current pathological fracture: Secondary | ICD-10-CM | POA: Diagnosis not present

## 2019-03-04 DIAGNOSIS — M810 Age-related osteoporosis without current pathological fracture: Secondary | ICD-10-CM | POA: Diagnosis not present

## 2019-03-10 DIAGNOSIS — D510 Vitamin B12 deficiency anemia due to intrinsic factor deficiency: Secondary | ICD-10-CM | POA: Diagnosis not present

## 2019-05-12 DIAGNOSIS — E785 Hyperlipidemia, unspecified: Secondary | ICD-10-CM | POA: Diagnosis not present

## 2019-05-12 DIAGNOSIS — D510 Vitamin B12 deficiency anemia due to intrinsic factor deficiency: Secondary | ICD-10-CM | POA: Diagnosis not present

## 2019-05-12 DIAGNOSIS — N3281 Overactive bladder: Secondary | ICD-10-CM | POA: Diagnosis not present

## 2019-05-12 DIAGNOSIS — D539 Nutritional anemia, unspecified: Secondary | ICD-10-CM | POA: Diagnosis not present

## 2019-05-12 DIAGNOSIS — J479 Bronchiectasis, uncomplicated: Secondary | ICD-10-CM | POA: Diagnosis not present

## 2019-05-12 DIAGNOSIS — E559 Vitamin D deficiency, unspecified: Secondary | ICD-10-CM | POA: Diagnosis not present

## 2019-05-12 DIAGNOSIS — M810 Age-related osteoporosis without current pathological fracture: Secondary | ICD-10-CM | POA: Diagnosis not present

## 2019-05-25 DIAGNOSIS — E785 Hyperlipidemia, unspecified: Secondary | ICD-10-CM | POA: Diagnosis not present

## 2019-05-25 DIAGNOSIS — Z1231 Encounter for screening mammogram for malignant neoplasm of breast: Secondary | ICD-10-CM | POA: Diagnosis not present

## 2019-05-25 DIAGNOSIS — Z1331 Encounter for screening for depression: Secondary | ICD-10-CM | POA: Diagnosis not present

## 2019-05-25 DIAGNOSIS — Z0000 Encounter for general adult medical examination without abnormal findings: Secondary | ICD-10-CM | POA: Diagnosis not present

## 2019-05-25 DIAGNOSIS — Z9181 History of falling: Secondary | ICD-10-CM | POA: Diagnosis not present

## 2019-06-04 DIAGNOSIS — M810 Age-related osteoporosis without current pathological fracture: Secondary | ICD-10-CM | POA: Diagnosis not present

## 2019-06-04 DIAGNOSIS — M818 Other osteoporosis without current pathological fracture: Secondary | ICD-10-CM | POA: Diagnosis not present

## 2019-06-04 DIAGNOSIS — N959 Unspecified menopausal and perimenopausal disorder: Secondary | ICD-10-CM | POA: Diagnosis not present

## 2019-06-30 DIAGNOSIS — D500 Iron deficiency anemia secondary to blood loss (chronic): Secondary | ICD-10-CM | POA: Diagnosis not present

## 2019-07-13 DIAGNOSIS — D510 Vitamin B12 deficiency anemia due to intrinsic factor deficiency: Secondary | ICD-10-CM | POA: Diagnosis not present

## 2019-08-13 DIAGNOSIS — D510 Vitamin B12 deficiency anemia due to intrinsic factor deficiency: Secondary | ICD-10-CM | POA: Diagnosis not present

## 2019-08-23 DIAGNOSIS — S79911A Unspecified injury of right hip, initial encounter: Secondary | ICD-10-CM | POA: Diagnosis not present

## 2019-08-23 DIAGNOSIS — S72001A Fracture of unspecified part of neck of right femur, initial encounter for closed fracture: Secondary | ICD-10-CM | POA: Diagnosis not present

## 2019-08-23 DIAGNOSIS — Z20822 Contact with and (suspected) exposure to covid-19: Secondary | ICD-10-CM | POA: Diagnosis not present

## 2019-08-23 DIAGNOSIS — S0101XA Laceration without foreign body of scalp, initial encounter: Secondary | ICD-10-CM | POA: Diagnosis not present

## 2019-08-23 DIAGNOSIS — M25551 Pain in right hip: Secondary | ICD-10-CM | POA: Diagnosis not present

## 2019-08-23 DIAGNOSIS — S0990XA Unspecified injury of head, initial encounter: Secondary | ICD-10-CM | POA: Diagnosis not present

## 2019-08-23 DIAGNOSIS — S299XXA Unspecified injury of thorax, initial encounter: Secondary | ICD-10-CM | POA: Diagnosis not present

## 2019-08-24 DIAGNOSIS — R278 Other lack of coordination: Secondary | ICD-10-CM | POA: Diagnosis not present

## 2019-08-24 DIAGNOSIS — R0902 Hypoxemia: Secondary | ICD-10-CM | POA: Diagnosis not present

## 2019-08-24 DIAGNOSIS — M25551 Pain in right hip: Secondary | ICD-10-CM | POA: Diagnosis not present

## 2019-08-24 DIAGNOSIS — I340 Nonrheumatic mitral (valve) insufficiency: Secondary | ICD-10-CM | POA: Diagnosis not present

## 2019-08-24 DIAGNOSIS — Z20822 Contact with and (suspected) exposure to covid-19: Secondary | ICD-10-CM | POA: Diagnosis not present

## 2019-08-24 DIAGNOSIS — K219 Gastro-esophageal reflux disease without esophagitis: Secondary | ICD-10-CM | POA: Diagnosis present

## 2019-08-24 DIAGNOSIS — S72144D Nondisplaced intertrochanteric fracture of right femur, subsequent encounter for closed fracture with routine healing: Secondary | ICD-10-CM | POA: Diagnosis not present

## 2019-08-24 DIAGNOSIS — Z79899 Other long term (current) drug therapy: Secondary | ICD-10-CM | POA: Diagnosis not present

## 2019-08-24 DIAGNOSIS — S72091A Other fracture of head and neck of right femur, initial encounter for closed fracture: Secondary | ICD-10-CM | POA: Diagnosis present

## 2019-08-24 DIAGNOSIS — S72001A Fracture of unspecified part of neck of right femur, initial encounter for closed fracture: Secondary | ICD-10-CM | POA: Diagnosis not present

## 2019-08-24 DIAGNOSIS — R5381 Other malaise: Secondary | ICD-10-CM | POA: Diagnosis present

## 2019-08-24 DIAGNOSIS — R011 Cardiac murmur, unspecified: Secondary | ICD-10-CM | POA: Diagnosis present

## 2019-08-24 DIAGNOSIS — D72829 Elevated white blood cell count, unspecified: Secondary | ICD-10-CM | POA: Diagnosis present

## 2019-08-24 DIAGNOSIS — S299XXA Unspecified injury of thorax, initial encounter: Secondary | ICD-10-CM | POA: Diagnosis not present

## 2019-08-24 DIAGNOSIS — Z9181 History of falling: Secondary | ICD-10-CM | POA: Diagnosis not present

## 2019-08-24 DIAGNOSIS — S0101XA Laceration without foreign body of scalp, initial encounter: Secondary | ICD-10-CM | POA: Diagnosis not present

## 2019-08-24 DIAGNOSIS — R531 Weakness: Secondary | ICD-10-CM | POA: Diagnosis not present

## 2019-08-24 DIAGNOSIS — I350 Nonrheumatic aortic (valve) stenosis: Secondary | ICD-10-CM | POA: Diagnosis present

## 2019-08-24 DIAGNOSIS — S60811A Abrasion of right wrist, initial encounter: Secondary | ICD-10-CM | POA: Diagnosis present

## 2019-08-24 DIAGNOSIS — S79911A Unspecified injury of right hip, initial encounter: Secondary | ICD-10-CM | POA: Diagnosis not present

## 2019-08-24 DIAGNOSIS — W19XXXA Unspecified fall, initial encounter: Secondary | ICD-10-CM | POA: Diagnosis not present

## 2019-08-24 DIAGNOSIS — M6281 Muscle weakness (generalized): Secondary | ICD-10-CM | POA: Diagnosis not present

## 2019-08-24 DIAGNOSIS — F419 Anxiety disorder, unspecified: Secondary | ICD-10-CM | POA: Diagnosis present

## 2019-08-24 DIAGNOSIS — Z7401 Bed confinement status: Secondary | ICD-10-CM | POA: Diagnosis not present

## 2019-08-24 DIAGNOSIS — S0990XA Unspecified injury of head, initial encounter: Secondary | ICD-10-CM | POA: Diagnosis not present

## 2019-08-24 DIAGNOSIS — I361 Nonrheumatic tricuspid (valve) insufficiency: Secondary | ICD-10-CM | POA: Diagnosis not present

## 2019-08-24 DIAGNOSIS — Z4789 Encounter for other orthopedic aftercare: Secondary | ICD-10-CM | POA: Diagnosis not present

## 2019-08-24 DIAGNOSIS — R296 Repeated falls: Secondary | ICD-10-CM | POA: Diagnosis not present

## 2019-08-24 DIAGNOSIS — R269 Unspecified abnormalities of gait and mobility: Secondary | ICD-10-CM | POA: Diagnosis not present

## 2019-08-24 DIAGNOSIS — M1990 Unspecified osteoarthritis, unspecified site: Secondary | ICD-10-CM | POA: Diagnosis present

## 2019-08-26 DIAGNOSIS — M6281 Muscle weakness (generalized): Secondary | ICD-10-CM | POA: Diagnosis not present

## 2019-08-26 DIAGNOSIS — M25551 Pain in right hip: Secondary | ICD-10-CM | POA: Diagnosis not present

## 2019-08-26 DIAGNOSIS — R269 Unspecified abnormalities of gait and mobility: Secondary | ICD-10-CM | POA: Diagnosis not present

## 2019-08-26 DIAGNOSIS — S0101XA Laceration without foreign body of scalp, initial encounter: Secondary | ICD-10-CM | POA: Diagnosis not present

## 2019-08-26 DIAGNOSIS — R011 Cardiac murmur, unspecified: Secondary | ICD-10-CM | POA: Diagnosis not present

## 2019-08-26 DIAGNOSIS — R296 Repeated falls: Secondary | ICD-10-CM | POA: Diagnosis not present

## 2019-08-26 DIAGNOSIS — R262 Difficulty in walking, not elsewhere classified: Secondary | ICD-10-CM | POA: Diagnosis not present

## 2019-08-26 DIAGNOSIS — S72144D Nondisplaced intertrochanteric fracture of right femur, subsequent encounter for closed fracture with routine healing: Secondary | ICD-10-CM | POA: Diagnosis not present

## 2019-08-26 DIAGNOSIS — R0902 Hypoxemia: Secondary | ICD-10-CM | POA: Diagnosis not present

## 2019-08-26 DIAGNOSIS — D72829 Elevated white blood cell count, unspecified: Secondary | ICD-10-CM | POA: Diagnosis not present

## 2019-08-26 DIAGNOSIS — S72001S Fracture of unspecified part of neck of right femur, sequela: Secondary | ICD-10-CM | POA: Diagnosis not present

## 2019-08-26 DIAGNOSIS — Z9889 Other specified postprocedural states: Secondary | ICD-10-CM | POA: Diagnosis not present

## 2019-08-26 DIAGNOSIS — Z4789 Encounter for other orthopedic aftercare: Secondary | ICD-10-CM | POA: Diagnosis not present

## 2019-08-26 DIAGNOSIS — Z7401 Bed confinement status: Secondary | ICD-10-CM | POA: Diagnosis not present

## 2019-08-26 DIAGNOSIS — R531 Weakness: Secondary | ICD-10-CM | POA: Diagnosis not present

## 2019-08-26 DIAGNOSIS — Z8781 Personal history of (healed) traumatic fracture: Secondary | ICD-10-CM | POA: Diagnosis not present

## 2019-08-26 DIAGNOSIS — S72001A Fracture of unspecified part of neck of right femur, initial encounter for closed fracture: Secondary | ICD-10-CM | POA: Diagnosis not present

## 2019-08-26 DIAGNOSIS — R278 Other lack of coordination: Secondary | ICD-10-CM | POA: Diagnosis not present

## 2019-08-28 DIAGNOSIS — W19XXXD Unspecified fall, subsequent encounter: Secondary | ICD-10-CM | POA: Diagnosis not present

## 2019-08-28 DIAGNOSIS — S72001S Fracture of unspecified part of neck of right femur, sequela: Secondary | ICD-10-CM | POA: Diagnosis not present

## 2019-08-28 DIAGNOSIS — R011 Cardiac murmur, unspecified: Secondary | ICD-10-CM | POA: Diagnosis not present

## 2019-08-28 DIAGNOSIS — J188 Other pneumonia, unspecified organism: Secondary | ICD-10-CM | POA: Diagnosis not present

## 2019-08-28 DIAGNOSIS — J189 Pneumonia, unspecified organism: Secondary | ICD-10-CM | POA: Diagnosis not present

## 2019-08-28 DIAGNOSIS — R531 Weakness: Secondary | ICD-10-CM | POA: Diagnosis not present

## 2019-08-28 DIAGNOSIS — D72829 Elevated white blood cell count, unspecified: Secondary | ICD-10-CM | POA: Diagnosis not present

## 2019-08-28 DIAGNOSIS — G8918 Other acute postprocedural pain: Secondary | ICD-10-CM | POA: Diagnosis not present

## 2019-08-28 DIAGNOSIS — R269 Unspecified abnormalities of gait and mobility: Secondary | ICD-10-CM | POA: Diagnosis not present

## 2019-08-28 DIAGNOSIS — S72144D Nondisplaced intertrochanteric fracture of right femur, subsequent encounter for closed fracture with routine healing: Secondary | ICD-10-CM | POA: Diagnosis not present

## 2019-08-28 DIAGNOSIS — Z9889 Other specified postprocedural states: Secondary | ICD-10-CM | POA: Diagnosis not present

## 2019-08-28 DIAGNOSIS — R262 Difficulty in walking, not elsewhere classified: Secondary | ICD-10-CM | POA: Diagnosis not present

## 2019-08-28 DIAGNOSIS — S0101XD Laceration without foreign body of scalp, subsequent encounter: Secondary | ICD-10-CM | POA: Diagnosis not present

## 2019-08-28 DIAGNOSIS — Z4789 Encounter for other orthopedic aftercare: Secondary | ICD-10-CM | POA: Diagnosis not present

## 2019-08-28 DIAGNOSIS — Z8781 Personal history of (healed) traumatic fracture: Secondary | ICD-10-CM | POA: Diagnosis not present

## 2019-08-28 DIAGNOSIS — R05 Cough: Secondary | ICD-10-CM | POA: Diagnosis not present

## 2019-09-01 DIAGNOSIS — R05 Cough: Secondary | ICD-10-CM | POA: Diagnosis not present

## 2019-09-02 ENCOUNTER — Other Ambulatory Visit: Payer: Self-pay | Admitting: *Deleted

## 2019-09-02 NOTE — Patient Outreach (Signed)
Screened for potential [facility] Care Management needs as a benefit of  [employer] Medicare.  Mrs. Jasmin Anderson is currently receiving skilled therapy at [facility].   Writer attended telephonic interdisciplinary team meeting to assess for disposition needs and transition plan for resident.   Facility reports member is from home alone with plans to return home. Has supportive family that lives nearby. She is s/p hip ORIF.   Will continue to follow for transition plans and potential [facility] needs.   Jasmin Rolling, MSN-Ed, RN,BSN [place] Acute Care Coordinator [phone] [facility]) [phone]  (Toll free office)

## 2019-09-04 DIAGNOSIS — J189 Pneumonia, unspecified organism: Secondary | ICD-10-CM | POA: Diagnosis not present

## 2019-09-04 DIAGNOSIS — R262 Difficulty in walking, not elsewhere classified: Secondary | ICD-10-CM | POA: Diagnosis not present

## 2019-09-04 DIAGNOSIS — G8918 Other acute postprocedural pain: Secondary | ICD-10-CM | POA: Diagnosis not present

## 2019-09-04 DIAGNOSIS — S72144D Nondisplaced intertrochanteric fracture of right femur, subsequent encounter for closed fracture with routine healing: Secondary | ICD-10-CM | POA: Diagnosis not present

## 2019-09-07 ENCOUNTER — Encounter: Payer: Self-pay | Admitting: *Deleted

## 2019-09-07 ENCOUNTER — Other Ambulatory Visit: Payer: Self-pay | Admitting: *Deleted

## 2019-09-07 NOTE — Patient Outreach (Signed)
[facility]) Care Management [facility] Community CM Telephone Outreach EMMI Red Alert- General discharge PCP office completes Transition of Care follow up post- hospital discharge   09/07/2019  [name] [birth year] [id number]  EMMI Red-Alert/ General Discharge EMMI call date/ day #: Friday September 04, 2019; day # 4 Red-Alert reason(s): "no discharge papers" and "No scheduled follow up"  Unsuccessful outgoing telephone outreach attempt to Jasmin Anderson, 84 y/o female referred to [facility] CM by [facility] CMA after EMMI Red Alert received as above; apparently patient was recently hospitalized for ORIF of hip and was discharged from hospital to SNF for rehabilitation; from review of recent [facility] RN CM PAC note, patient was subsequently discharged from SNF back to her home/ self-care.  Unable to leave patient HIPAA compliant voice mail message requesting return call back-- received automated outgoing message on her voice mail stating that "memory is full;" and unable to accept new messages.  Plan:  Will place [facility] Community CM unsuccessful patient outreach letter in mail requesting call back in writing  Will re-attempt [place] telephone outreach within 4 business days if I do not hear back from patient first  [name], RN, BSN, [employer] [facility] Care Management  [phone]

## 2019-09-09 ENCOUNTER — Encounter: Payer: Self-pay | Admitting: *Deleted

## 2019-09-09 ENCOUNTER — Other Ambulatory Visit: Payer: Self-pay | Admitting: *Deleted

## 2019-09-09 NOTE — Patient Outreach (Signed)
[facility]) Care Management [facility] CM Telephone Outreach, [place] Red Alert notification Second outreach attempt 09/09/2019  [name] 04/22/1936 [id number]  EMMI Red-Alert/ General Discharge EMMI call date/ day #: Friday September 04, 2019; day # 4 Red-Alert reason(s): "no discharge papers" and "No scheduled follow up"  Successful outgoing telephone outreach attempt to "Jasmin Anderson" who identifies herself as patient's sister--   Jasmin Anderson, 84 y/o female referred to [facility] CM by [facility] CMA after EMMI Red Alert received as above; apparently patient was recently hospitalized for ORIF of hip and was discharged from hospital to SNF for rehabilitation; from review of recent [facility] PAC note, patient was subsequently discharged from SNF back to her home/ self-care.  Patient's sister shares today that patient has not been discharged from SNF/ rehabilitation center ([facility]), and she reports that patient has been there since January after her recent hip surgery.  Sister states that patient hopes to be discharged back to her home "within the next week or two," but states several times that she currently remains at [facility] facility.  Plan:  Will make patient inactive with [place] CM as she is currently in SNF for rehabilitation  [name], RN, BSN, [place] Care Management  [phone]

## 2019-09-10 ENCOUNTER — Other Ambulatory Visit: Payer: Self-pay | Admitting: *Deleted

## 2019-09-10 NOTE — Patient Outreach (Signed)
Late entry for 09/09/19  Screened for potential [facility] Care Management needs as a benefit of  [employer] Medicare.  Mrs. Kogan is currently receiving skilled therapy at [facility].   Writer attended telephonic interdisciplinary team meeting to assess for disposition needs and transition plan for resident.    Transition plan is to return home with home health.   Will plan outreach to discuss [place] Management follow up.  Will continue to follow while member resides in SNF.   Marthenia Rolling, MSN-Ed, RN,BSN [place] Acute Care Coordinator [phone] [facility]) [phone]  (Toll free office)

## 2019-09-11 ENCOUNTER — Other Ambulatory Visit: Payer: Self-pay | Admitting: *Deleted

## 2019-09-11 NOTE — Patient Outreach (Signed)
Member screened for potential [facility] Care Management needs as a benefit of [place] Medicare.  Mrs. Engelbert remains at [place].  Telephone call made to Mrs. Harth [phone] to discuss [facility] follow up post SNF stay. No answer. Left HIPAA compliant voicemail message. Appears to be home phone rather than mobile.   Will follow up with SNF in attempt to reach Mrs. Raymond to discuss [place] Management services.   Will continue to follow while member resides in SNF.   Marthenia Rolling, MSN-Ed, RN,BSN [place] Acute Care Coordinator [phone] [facility]) [phone]  (Toll free office)

## 2019-09-14 ENCOUNTER — Other Ambulatory Visit: Payer: Self-pay | Admitting: *Deleted

## 2019-09-14 NOTE — Patient Outreach (Signed)
[facility] Post Acute Care Coordinator follow up.  [name] is currently receiving skilled therapy at [facility].   Phone number on file is home number. Telephone call made to facility nursing station. They were able to connect Probation officer with member. Spoke with [name] about [place] Management follow up post SNF. She is agreeable. States she is slated for dc on this Thursday. States she has family that lives nearby within a mile distance who will assist as needed.   Explained [facility] Care Management will not interfere or replace services provided by home health.   [name] also provided her mobile number as [phone].   Will plan to make referral to [place] upon SNF dc.   Will continue to follow while member resides in SNF.    Marthenia Rolling, MSN-Ed, RN,BSN [place] Acute Care Coordinator [phone] [facility]) [phone]  (Toll free office)

## 2019-09-16 ENCOUNTER — Other Ambulatory Visit: Payer: Self-pay | Admitting: *Deleted

## 2019-09-16 NOTE — Patient Outreach (Signed)
Screened for potential [facility] Care Management needs as a benefit of  [employer] Medicare.  Jasmin Anderson is currently receiving skilled therapy at [facility].   Writer attended telephonic interdisciplinary team meeting to assess for disposition needs and transition plan for resident.   Facility reports member will transition to home next Wednesday 09/23/19. Therapy director states she has recommended to member's daughter that [name] have someone stay with her post SNF. Member lived alone prior.  Will plan to make [place] Management referral upon SNF dc.  Will continue to follow while member resides in [facility].   Marthenia Rolling, MSN-Ed, RN,BSN [place] Acute Care Coordinator [phone] [facility]) [phone]  (Toll free office)

## 2019-09-23 ENCOUNTER — Other Ambulatory Visit: Payer: Self-pay | Admitting: *Deleted

## 2019-09-23 DIAGNOSIS — I10 Essential (primary) hypertension: Secondary | ICD-10-CM

## 2019-09-23 NOTE — Patient Outreach (Signed)
[facility] Post Acute Care Coordinator follow up  Confirmed with [facility] SNF dc planner that Jasmin Anderson transitioned to home today 09/23/19 with [facility] services.   Jasmin Anderson s/p recent right hip ORIF after fall. Medical history of GERD, OA, HTN, spinal surgeries.  Will make referral to [place].  Marthenia Rolling, MSN-Ed, RN,BSN [place] Acute Care Coordinator [phone] [facility]) [phone]  (Toll free office)

## 2019-09-24 ENCOUNTER — Other Ambulatory Visit: Payer: Self-pay

## 2019-09-24 NOTE — Patient Outreach (Signed)
Telephone assessment: New referral:  MD office does transition of care; Discharged from [facility] on 09/23/2019  Placed call to patient and explained reason for call. Reviewed [facility] program.  Patient confirms that she was discharged on 09/23/2019.  Reports she has someone staying with her. Reports last night she had a water leak and the insurance adjuster is coming. Reports home health PT is coming today as well.   Patient reports she has all her medications and is taking them as directed. Reports she is walking with a walker and uses a wheelchair to get to the bathroom. Reports she has increased pain when standing up but when she gets moving she then feels better.    Patient reports she will see primary MD on 09/30/2019.    PLAN: will plan additional phone call in 1 week. Reviewed importance of fall precautions and PT. Reviewed importance of follow up with MD .  Tomasa Rand, RN, BSN, CEN [place] Coordinator [phone]

## 2019-09-25 DIAGNOSIS — E46 Unspecified protein-calorie malnutrition: Secondary | ICD-10-CM | POA: Diagnosis not present

## 2019-09-25 DIAGNOSIS — D649 Anemia, unspecified: Secondary | ICD-10-CM | POA: Diagnosis not present

## 2019-09-25 DIAGNOSIS — R011 Cardiac murmur, unspecified: Secondary | ICD-10-CM | POA: Diagnosis not present

## 2019-09-25 DIAGNOSIS — K219 Gastro-esophageal reflux disease without esophagitis: Secondary | ICD-10-CM | POA: Diagnosis not present

## 2019-09-25 DIAGNOSIS — K5909 Other constipation: Secondary | ICD-10-CM | POA: Diagnosis not present

## 2019-09-25 DIAGNOSIS — M80051D Age-related osteoporosis with current pathological fracture, right femur, subsequent encounter for fracture with routine healing: Secondary | ICD-10-CM | POA: Diagnosis not present

## 2019-09-25 DIAGNOSIS — Z7982 Long term (current) use of aspirin: Secondary | ICD-10-CM | POA: Diagnosis not present

## 2019-09-25 DIAGNOSIS — J479 Bronchiectasis, uncomplicated: Secondary | ICD-10-CM | POA: Diagnosis not present

## 2019-09-27 DIAGNOSIS — R011 Cardiac murmur, unspecified: Secondary | ICD-10-CM | POA: Diagnosis not present

## 2019-09-27 DIAGNOSIS — M80051D Age-related osteoporosis with current pathological fracture, right femur, subsequent encounter for fracture with routine healing: Secondary | ICD-10-CM | POA: Diagnosis not present

## 2019-09-27 DIAGNOSIS — K5909 Other constipation: Secondary | ICD-10-CM | POA: Diagnosis not present

## 2019-09-27 DIAGNOSIS — J479 Bronchiectasis, uncomplicated: Secondary | ICD-10-CM | POA: Diagnosis not present

## 2019-09-27 DIAGNOSIS — D649 Anemia, unspecified: Secondary | ICD-10-CM | POA: Diagnosis not present

## 2019-09-27 DIAGNOSIS — K219 Gastro-esophageal reflux disease without esophagitis: Secondary | ICD-10-CM | POA: Diagnosis not present

## 2019-09-29 DIAGNOSIS — K5909 Other constipation: Secondary | ICD-10-CM | POA: Diagnosis not present

## 2019-09-29 DIAGNOSIS — D649 Anemia, unspecified: Secondary | ICD-10-CM | POA: Diagnosis not present

## 2019-09-29 DIAGNOSIS — M80051D Age-related osteoporosis with current pathological fracture, right femur, subsequent encounter for fracture with routine healing: Secondary | ICD-10-CM | POA: Diagnosis not present

## 2019-09-29 DIAGNOSIS — R011 Cardiac murmur, unspecified: Secondary | ICD-10-CM | POA: Diagnosis not present

## 2019-09-29 DIAGNOSIS — K219 Gastro-esophageal reflux disease without esophagitis: Secondary | ICD-10-CM | POA: Diagnosis not present

## 2019-09-29 DIAGNOSIS — J479 Bronchiectasis, uncomplicated: Secondary | ICD-10-CM | POA: Diagnosis not present

## 2019-09-30 DIAGNOSIS — J479 Bronchiectasis, uncomplicated: Secondary | ICD-10-CM | POA: Diagnosis not present

## 2019-09-30 DIAGNOSIS — K5909 Other constipation: Secondary | ICD-10-CM | POA: Diagnosis not present

## 2019-09-30 DIAGNOSIS — D649 Anemia, unspecified: Secondary | ICD-10-CM | POA: Diagnosis not present

## 2019-09-30 DIAGNOSIS — R011 Cardiac murmur, unspecified: Secondary | ICD-10-CM | POA: Diagnosis not present

## 2019-09-30 DIAGNOSIS — K219 Gastro-esophageal reflux disease without esophagitis: Secondary | ICD-10-CM | POA: Diagnosis not present

## 2019-09-30 DIAGNOSIS — M80051D Age-related osteoporosis with current pathological fracture, right femur, subsequent encounter for fracture with routine healing: Secondary | ICD-10-CM | POA: Diagnosis not present

## 2019-10-01 ENCOUNTER — Other Ambulatory Visit: Payer: Self-pay

## 2019-10-01 DIAGNOSIS — D510 Vitamin B12 deficiency anemia due to intrinsic factor deficiency: Secondary | ICD-10-CM | POA: Diagnosis not present

## 2019-10-01 DIAGNOSIS — F419 Anxiety disorder, unspecified: Secondary | ICD-10-CM | POA: Diagnosis not present

## 2019-10-01 DIAGNOSIS — K219 Gastro-esophageal reflux disease without esophagitis: Secondary | ICD-10-CM | POA: Diagnosis not present

## 2019-10-01 DIAGNOSIS — Z79899 Other long term (current) drug therapy: Secondary | ICD-10-CM | POA: Diagnosis not present

## 2019-10-01 DIAGNOSIS — Z8781 Personal history of (healed) traumatic fracture: Secondary | ICD-10-CM | POA: Diagnosis not present

## 2019-10-02 NOTE — Patient Outreach (Signed)
Telephone assessment:  Placed call to patient for weekly follow up. Patient reports she is doing well. Reports no new falls. Reports good family support to assist if needed.  Reports she is working with home health PT.  Reports therapist has been coming. Reports that she continues to use a walker.  Reports Tramadol is working well for her pain.  Denies any new problems or concerns today.  PLAN: will follow up in 1 week.  Tomasa Rand, RN, BSN, CEN [facility] [employer] [phone]

## 2019-10-07 ENCOUNTER — Other Ambulatory Visit: Payer: Self-pay

## 2019-10-07 DIAGNOSIS — K219 Gastro-esophageal reflux disease without esophagitis: Secondary | ICD-10-CM | POA: Diagnosis not present

## 2019-10-07 DIAGNOSIS — D649 Anemia, unspecified: Secondary | ICD-10-CM | POA: Diagnosis not present

## 2019-10-07 DIAGNOSIS — R011 Cardiac murmur, unspecified: Secondary | ICD-10-CM | POA: Diagnosis not present

## 2019-10-07 DIAGNOSIS — M80051D Age-related osteoporosis with current pathological fracture, right femur, subsequent encounter for fracture with routine healing: Secondary | ICD-10-CM | POA: Diagnosis not present

## 2019-10-07 DIAGNOSIS — J479 Bronchiectasis, uncomplicated: Secondary | ICD-10-CM | POA: Diagnosis not present

## 2019-10-07 DIAGNOSIS — D510 Vitamin B12 deficiency anemia due to intrinsic factor deficiency: Secondary | ICD-10-CM | POA: Diagnosis not present

## 2019-10-07 DIAGNOSIS — K5909 Other constipation: Secondary | ICD-10-CM | POA: Diagnosis not present

## 2019-10-07 NOTE — Patient Outreach (Signed)
[facility]) Care Management   10/07/2019  Jasmin Anderson Jan 24, 1936 [id number]  Jasmin Anderson is an 84 y.o. female  Subjective: Patient reports she is doing well recovering from her right hip fracture. Reports one of her 13 dogs died this morning and she is sad. 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  States that her sister is her main caregiver at this time. Patient reports that she is actively working with PT twice a week and does her daily exercises. Patient reports she slipped on a dog food bag with caused her fall and hip fracture.  Patient reports she is walking with a walker and has a stair rider to get up and down the steps. Patient reports using the bedside commode in the shower.  Patient reports since she got home from rehab she had a water leak and is trying to get everything settled.   Objective:   Vitals:   10/07/19 1438  Weight: 93 lb (42.2 kg)  Height: 1.626 m (_0 )   Review of Systems see above  Physical Exam telephone assessment only  Encounter Medications:   Outpatient Encounter Medications as of 10/07/2019  Medication Sig  . acetaminophen (TYLENOL) 650 MG CR tablet Take 1,300 mg by mouth 2 (two) times daily.  Marland Kitchen ALPRAZolam (XANAX) 0.25 MG tablet Take 0.25 mg by mouth daily as needed for anxiety.  . Cyanocobalamin (B-12 COMPLIANCE INJECTION IJ) Inject 1 Dose as directed every 30 (thirty) days.  Marland Kitchen esomeprazole (NEXIUM) 40 MG capsule Take 40 mg by mouth daily.   Marland Kitchen OVER THE COUNTER MEDICATION Take 2 tablets by mouth daily. Clear lung otc supplement  . oxyCODONE (OXY IR/ROXICODONE) 5 MG immediate release tablet Take 5 mg by mouth every 6 (six) hours as needed for pain.  . traMADol (ULTRAM) 50 MG tablet Take 50 mg by mouth every 6 (six) hours as needed.   No facility-administered encounter medications on file as of 10/07/2019.    Functional Status:   In your present state of health, do you have any difficulty performing the following activities: 10/07/2019   Hearing? N  Vision? N  Difficulty concentrating or making decisions? N  Comment stair rider  Walking or climbing stairs? Y  Dressing or bathing? N  Doing errands, shopping? Y  Preparing Food and eating ? Y  Using the Toilet? Y  Comment bed side commode  In the past six months, have you accidently leaked urine? Y  Do you have problems with loss of bowel control? N  Managing your Medications? N  Managing your Finances? N  Housekeeping or managing your Housekeeping? Y  Comment has a housekeeper  Some recent data might be hidden    Fall/Depression Screening:    Fall Risk  10/07/2019 06/19/2018  Falls in the past year? 1 0  Comment - [place] Telephone Survey: data to providers prior to load  Number falls in past yr: 0 -  Injury with Fall? 1 -  Risk for fall due to : History of fall(s) -  Follow up Falls evaluation completed;Education provided;Falls prevention discussed -   PHQ 2/9 Scores 10/07/2019  PHQ - 2 Score 0    Assessment:   (1) Reports continues to be active with PT (2) hip pain worse with movement (3) takes all medications as prescribed. (4) fall risk (5) nutritional risk due to BMI of 15.96  Plan:  (1) reviewed importance of continuing to do daily exercises and work with PT. (2) reviewed pain control measures and encouraged patient to take  pain medications before pain to unbearable. (3) reviewed all medications and patient denies any concerns or problems with medications. (4) reviewed fall risk and need for be safe at home with walker.  (5) reviewed use of nutritional supplements. Encouraged healthy eating habits. Patient voiced understanding.  Will follow up with patient in 1 week. Will send this note to MD. [facility] CM Care Plan Problem One     Most Recent Value  Care Plan Problem One  Recent admission for fractured hip.  Role Documenting the Problem One  Care Management [place] for Problem One  Active  THN Long Term Goal   Patient will report no  readmissions to the hospital for the next 31 days.   THN Long Term Goal Start Date  09/24/19  Interventions for Problem One Long Term Goal  Reviewed assessment and encoruaged patient safety.  THN CM Short Term Goal #1   Patient will report home health services have started in the next 7 days.   THN CM Short Term Goal #1 Start Date  09/24/19  [facility] CM Short Term Goal #1 Met Date  10/02/19  THN CM Short Term Goal #2   Patient will report no falls in the next 30 days.   THN CM Short Term Goal #2 Start Date  09/24/19  Interventions for Short Term Goal #2  Reviewed fall precautions and need for use of walker.      Tomasa Rand, RN, BSN, CEN [facility] [employer] [phone]

## 2019-10-09 DIAGNOSIS — K219 Gastro-esophageal reflux disease without esophagitis: Secondary | ICD-10-CM | POA: Diagnosis not present

## 2019-10-09 DIAGNOSIS — M80051D Age-related osteoporosis with current pathological fracture, right femur, subsequent encounter for fracture with routine healing: Secondary | ICD-10-CM | POA: Diagnosis not present

## 2019-10-09 DIAGNOSIS — K5909 Other constipation: Secondary | ICD-10-CM | POA: Diagnosis not present

## 2019-10-09 DIAGNOSIS — D649 Anemia, unspecified: Secondary | ICD-10-CM | POA: Diagnosis not present

## 2019-10-09 DIAGNOSIS — J479 Bronchiectasis, uncomplicated: Secondary | ICD-10-CM | POA: Diagnosis not present

## 2019-10-09 DIAGNOSIS — R011 Cardiac murmur, unspecified: Secondary | ICD-10-CM | POA: Diagnosis not present

## 2019-10-13 DIAGNOSIS — R011 Cardiac murmur, unspecified: Secondary | ICD-10-CM | POA: Diagnosis not present

## 2019-10-13 DIAGNOSIS — M80051D Age-related osteoporosis with current pathological fracture, right femur, subsequent encounter for fracture with routine healing: Secondary | ICD-10-CM | POA: Diagnosis not present

## 2019-10-13 DIAGNOSIS — J479 Bronchiectasis, uncomplicated: Secondary | ICD-10-CM | POA: Diagnosis not present

## 2019-10-13 DIAGNOSIS — D649 Anemia, unspecified: Secondary | ICD-10-CM | POA: Diagnosis not present

## 2019-10-13 DIAGNOSIS — K219 Gastro-esophageal reflux disease without esophagitis: Secondary | ICD-10-CM | POA: Diagnosis not present

## 2019-10-13 DIAGNOSIS — K5909 Other constipation: Secondary | ICD-10-CM | POA: Diagnosis not present

## 2019-10-15 ENCOUNTER — Other Ambulatory Visit: Payer: Self-pay

## 2019-10-15 DIAGNOSIS — M80051D Age-related osteoporosis with current pathological fracture, right femur, subsequent encounter for fracture with routine healing: Secondary | ICD-10-CM | POA: Diagnosis not present

## 2019-10-15 DIAGNOSIS — J479 Bronchiectasis, uncomplicated: Secondary | ICD-10-CM | POA: Diagnosis not present

## 2019-10-15 DIAGNOSIS — K5909 Other constipation: Secondary | ICD-10-CM | POA: Diagnosis not present

## 2019-10-15 DIAGNOSIS — D649 Anemia, unspecified: Secondary | ICD-10-CM | POA: Diagnosis not present

## 2019-10-15 DIAGNOSIS — R011 Cardiac murmur, unspecified: Secondary | ICD-10-CM | POA: Diagnosis not present

## 2019-10-15 DIAGNOSIS — K219 Gastro-esophageal reflux disease without esophagitis: Secondary | ICD-10-CM | POA: Diagnosis not present

## 2019-10-15 NOTE — Patient Outreach (Signed)
Telephone assessment:  Placed call to patient who reports she is doing pretty well.  Reports her hip is the same.  Denies any new falls. Reports the pain is the same. Denies any new issues or concerns.   States PT is scheduled to see her today.  PLAN: follow up in 10 days via phone. Encouraged patient to call sooner if needed.  Tomasa Rand, RN, BSN, CEN [facility] [employer] [phone]

## 2019-10-19 DIAGNOSIS — M80051D Age-related osteoporosis with current pathological fracture, right femur, subsequent encounter for fracture with routine healing: Secondary | ICD-10-CM | POA: Diagnosis not present

## 2019-10-19 DIAGNOSIS — K5909 Other constipation: Secondary | ICD-10-CM | POA: Diagnosis not present

## 2019-10-19 DIAGNOSIS — D649 Anemia, unspecified: Secondary | ICD-10-CM | POA: Diagnosis not present

## 2019-10-19 DIAGNOSIS — K219 Gastro-esophageal reflux disease without esophagitis: Secondary | ICD-10-CM | POA: Diagnosis not present

## 2019-10-19 DIAGNOSIS — R011 Cardiac murmur, unspecified: Secondary | ICD-10-CM | POA: Diagnosis not present

## 2019-10-19 DIAGNOSIS — J479 Bronchiectasis, uncomplicated: Secondary | ICD-10-CM | POA: Diagnosis not present

## 2019-10-20 DIAGNOSIS — R011 Cardiac murmur, unspecified: Secondary | ICD-10-CM | POA: Diagnosis not present

## 2019-10-20 DIAGNOSIS — M80051D Age-related osteoporosis with current pathological fracture, right femur, subsequent encounter for fracture with routine healing: Secondary | ICD-10-CM | POA: Diagnosis not present

## 2019-10-20 DIAGNOSIS — D649 Anemia, unspecified: Secondary | ICD-10-CM | POA: Diagnosis not present

## 2019-10-20 DIAGNOSIS — K219 Gastro-esophageal reflux disease without esophagitis: Secondary | ICD-10-CM | POA: Diagnosis not present

## 2019-10-20 DIAGNOSIS — J479 Bronchiectasis, uncomplicated: Secondary | ICD-10-CM | POA: Diagnosis not present

## 2019-10-20 DIAGNOSIS — K5909 Other constipation: Secondary | ICD-10-CM | POA: Diagnosis not present

## 2019-10-22 DIAGNOSIS — M80051D Age-related osteoporosis with current pathological fracture, right femur, subsequent encounter for fracture with routine healing: Secondary | ICD-10-CM | POA: Diagnosis not present

## 2019-10-22 DIAGNOSIS — K5909 Other constipation: Secondary | ICD-10-CM | POA: Diagnosis not present

## 2019-10-22 DIAGNOSIS — D649 Anemia, unspecified: Secondary | ICD-10-CM | POA: Diagnosis not present

## 2019-10-22 DIAGNOSIS — K219 Gastro-esophageal reflux disease without esophagitis: Secondary | ICD-10-CM | POA: Diagnosis not present

## 2019-10-22 DIAGNOSIS — J479 Bronchiectasis, uncomplicated: Secondary | ICD-10-CM | POA: Diagnosis not present

## 2019-10-22 DIAGNOSIS — R011 Cardiac murmur, unspecified: Secondary | ICD-10-CM | POA: Diagnosis not present

## 2019-10-23 ENCOUNTER — Other Ambulatory Visit: Payer: Self-pay

## 2019-10-23 DIAGNOSIS — S72009A Fracture of unspecified part of neck of unspecified femur, initial encounter for closed fracture: Secondary | ICD-10-CM | POA: Diagnosis not present

## 2019-10-23 DIAGNOSIS — S72001D Fracture of unspecified part of neck of right femur, subsequent encounter for closed fracture with routine healing: Secondary | ICD-10-CM | POA: Diagnosis not present

## 2019-10-23 NOTE — Patient Outreach (Signed)
Case Closure:  Placed call to patient for follow up and case closure. Spoke with patient who reports she is not doing well today. Reports just leaving MD office and was informed that she needs a revision of hip replacement. Reports screws are coming out.  States her new surgery is scheduled for 11/09/2019.  Reports she is going to try to regain some strength and drink lots of ensure.   Reviewed goals met for this episode and patient agreed. Patient in agreement for case closure until her next surgery and rehab stay. I will mail case closure letter to patient and I encouraged patient to call me for concerns in the future. Patient agreed.  PLAN: case closure- goals met.  Tomasa Rand, RN, BSN, CEN [facility] [employer] [phone]

## 2019-10-25 DIAGNOSIS — K219 Gastro-esophageal reflux disease without esophagitis: Secondary | ICD-10-CM | POA: Diagnosis not present

## 2019-10-25 DIAGNOSIS — D649 Anemia, unspecified: Secondary | ICD-10-CM | POA: Diagnosis not present

## 2019-10-25 DIAGNOSIS — K5909 Other constipation: Secondary | ICD-10-CM | POA: Diagnosis not present

## 2019-10-25 DIAGNOSIS — R011 Cardiac murmur, unspecified: Secondary | ICD-10-CM | POA: Diagnosis not present

## 2019-10-25 DIAGNOSIS — Z7982 Long term (current) use of aspirin: Secondary | ICD-10-CM | POA: Diagnosis not present

## 2019-10-25 DIAGNOSIS — M80051D Age-related osteoporosis with current pathological fracture, right femur, subsequent encounter for fracture with routine healing: Secondary | ICD-10-CM | POA: Diagnosis not present

## 2019-10-25 DIAGNOSIS — E46 Unspecified protein-calorie malnutrition: Secondary | ICD-10-CM | POA: Diagnosis not present

## 2019-10-25 DIAGNOSIS — J479 Bronchiectasis, uncomplicated: Secondary | ICD-10-CM | POA: Diagnosis not present

## 2019-10-26 DIAGNOSIS — R52 Pain, unspecified: Secondary | ICD-10-CM | POA: Diagnosis not present

## 2019-10-26 DIAGNOSIS — I700 Atherosclerosis of aorta: Secondary | ICD-10-CM | POA: Diagnosis not present

## 2019-10-26 DIAGNOSIS — Z79899 Other long term (current) drug therapy: Secondary | ICD-10-CM | POA: Diagnosis not present

## 2019-10-26 DIAGNOSIS — M79609 Pain in unspecified limb: Secondary | ICD-10-CM | POA: Diagnosis not present

## 2019-10-26 DIAGNOSIS — Z01818 Encounter for other preprocedural examination: Secondary | ICD-10-CM | POA: Diagnosis not present

## 2019-10-26 DIAGNOSIS — E559 Vitamin D deficiency, unspecified: Secondary | ICD-10-CM | POA: Diagnosis not present

## 2019-10-29 DIAGNOSIS — K219 Gastro-esophageal reflux disease without esophagitis: Secondary | ICD-10-CM | POA: Diagnosis not present

## 2019-10-29 DIAGNOSIS — K5909 Other constipation: Secondary | ICD-10-CM | POA: Diagnosis not present

## 2019-10-29 DIAGNOSIS — J479 Bronchiectasis, uncomplicated: Secondary | ICD-10-CM | POA: Diagnosis not present

## 2019-10-29 DIAGNOSIS — D649 Anemia, unspecified: Secondary | ICD-10-CM | POA: Diagnosis not present

## 2019-10-29 DIAGNOSIS — R011 Cardiac murmur, unspecified: Secondary | ICD-10-CM | POA: Diagnosis not present

## 2019-10-29 DIAGNOSIS — M80051D Age-related osteoporosis with current pathological fracture, right femur, subsequent encounter for fracture with routine healing: Secondary | ICD-10-CM | POA: Diagnosis not present

## 2019-11-03 DIAGNOSIS — Z1159 Encounter for screening for other viral diseases: Secondary | ICD-10-CM | POA: Diagnosis not present

## 2019-11-03 DIAGNOSIS — Z1152 Encounter for screening for COVID-19: Secondary | ICD-10-CM | POA: Diagnosis not present

## 2019-11-04 DIAGNOSIS — J479 Bronchiectasis, uncomplicated: Secondary | ICD-10-CM | POA: Diagnosis not present

## 2019-11-04 DIAGNOSIS — D649 Anemia, unspecified: Secondary | ICD-10-CM | POA: Diagnosis not present

## 2019-11-04 DIAGNOSIS — R011 Cardiac murmur, unspecified: Secondary | ICD-10-CM | POA: Diagnosis not present

## 2019-11-04 DIAGNOSIS — K5909 Other constipation: Secondary | ICD-10-CM | POA: Diagnosis not present

## 2019-11-04 DIAGNOSIS — K219 Gastro-esophageal reflux disease without esophagitis: Secondary | ICD-10-CM | POA: Diagnosis not present

## 2019-11-04 DIAGNOSIS — M80051D Age-related osteoporosis with current pathological fracture, right femur, subsequent encounter for fracture with routine healing: Secondary | ICD-10-CM | POA: Diagnosis not present

## 2019-11-09 DIAGNOSIS — Z471 Aftercare following joint replacement surgery: Secondary | ICD-10-CM | POA: Diagnosis not present

## 2019-11-09 DIAGNOSIS — Z79899 Other long term (current) drug therapy: Secondary | ICD-10-CM | POA: Diagnosis not present

## 2019-11-09 DIAGNOSIS — D72829 Elevated white blood cell count, unspecified: Secondary | ICD-10-CM | POA: Diagnosis not present

## 2019-11-09 DIAGNOSIS — S72001D Fracture of unspecified part of neck of right femur, subsequent encounter for closed fracture with routine healing: Secondary | ICD-10-CM | POA: Diagnosis not present

## 2019-11-09 DIAGNOSIS — K219 Gastro-esophageal reflux disease without esophagitis: Secondary | ICD-10-CM | POA: Diagnosis present

## 2019-11-09 DIAGNOSIS — D510 Vitamin B12 deficiency anemia due to intrinsic factor deficiency: Secondary | ICD-10-CM | POA: Diagnosis present

## 2019-11-09 DIAGNOSIS — R011 Cardiac murmur, unspecified: Secondary | ICD-10-CM | POA: Diagnosis not present

## 2019-11-09 DIAGNOSIS — S72144D Nondisplaced intertrochanteric fracture of right femur, subsequent encounter for closed fracture with routine healing: Secondary | ICD-10-CM | POA: Diagnosis not present

## 2019-11-09 DIAGNOSIS — M87059 Idiopathic aseptic necrosis of unspecified femur: Secondary | ICD-10-CM | POA: Diagnosis not present

## 2019-11-09 DIAGNOSIS — W19XXXD Unspecified fall, subsequent encounter: Secondary | ICD-10-CM | POA: Diagnosis not present

## 2019-11-09 DIAGNOSIS — R600 Localized edema: Secondary | ICD-10-CM | POA: Diagnosis present

## 2019-11-09 DIAGNOSIS — I10 Essential (primary) hypertension: Secondary | ICD-10-CM | POA: Diagnosis not present

## 2019-11-09 DIAGNOSIS — M1611 Unilateral primary osteoarthritis, right hip: Secondary | ICD-10-CM | POA: Diagnosis not present

## 2019-11-09 DIAGNOSIS — Z4789 Encounter for other orthopedic aftercare: Secondary | ICD-10-CM | POA: Diagnosis not present

## 2019-11-09 DIAGNOSIS — M87051 Idiopathic aseptic necrosis of right femur: Secondary | ICD-10-CM | POA: Diagnosis present

## 2019-11-09 DIAGNOSIS — Z7401 Bed confinement status: Secondary | ICD-10-CM | POA: Diagnosis not present

## 2019-11-09 DIAGNOSIS — Z96641 Presence of right artificial hip joint: Secondary | ICD-10-CM | POA: Diagnosis not present

## 2019-11-09 DIAGNOSIS — M1990 Unspecified osteoarthritis, unspecified site: Secondary | ICD-10-CM | POA: Diagnosis present

## 2019-11-09 DIAGNOSIS — R531 Weakness: Secondary | ICD-10-CM | POA: Diagnosis not present

## 2019-11-09 DIAGNOSIS — Z472 Encounter for removal of internal fixation device: Secondary | ICD-10-CM | POA: Diagnosis not present

## 2019-11-09 DIAGNOSIS — J188 Other pneumonia, unspecified organism: Secondary | ICD-10-CM | POA: Diagnosis not present

## 2019-11-09 DIAGNOSIS — J449 Chronic obstructive pulmonary disease, unspecified: Secondary | ICD-10-CM | POA: Diagnosis present

## 2019-11-09 DIAGNOSIS — F419 Anxiety disorder, unspecified: Secondary | ICD-10-CM | POA: Diagnosis present

## 2019-11-09 DIAGNOSIS — E785 Hyperlipidemia, unspecified: Secondary | ICD-10-CM | POA: Diagnosis present

## 2019-11-09 DIAGNOSIS — E559 Vitamin D deficiency, unspecified: Secondary | ICD-10-CM | POA: Diagnosis present

## 2019-11-09 DIAGNOSIS — Z7982 Long term (current) use of aspirin: Secondary | ICD-10-CM | POA: Diagnosis not present

## 2019-11-09 DIAGNOSIS — Z96649 Presence of unspecified artificial hip joint: Secondary | ICD-10-CM | POA: Diagnosis present

## 2019-11-09 DIAGNOSIS — M87251 Osteonecrosis due to previous trauma, right femur: Secondary | ICD-10-CM | POA: Diagnosis not present

## 2019-11-09 DIAGNOSIS — S72001S Fracture of unspecified part of neck of right femur, sequela: Secondary | ICD-10-CM | POA: Diagnosis not present

## 2019-11-09 DIAGNOSIS — R69 Illness, unspecified: Secondary | ICD-10-CM | POA: Diagnosis not present

## 2019-11-09 DIAGNOSIS — M810 Age-related osteoporosis without current pathological fracture: Secondary | ICD-10-CM | POA: Diagnosis present

## 2019-11-09 DIAGNOSIS — S0101XD Laceration without foreign body of scalp, subsequent encounter: Secondary | ICD-10-CM | POA: Diagnosis not present

## 2019-11-09 DIAGNOSIS — R269 Unspecified abnormalities of gait and mobility: Secondary | ICD-10-CM | POA: Diagnosis not present

## 2019-11-09 DIAGNOSIS — K5903 Drug induced constipation: Secondary | ICD-10-CM | POA: Diagnosis present

## 2019-11-09 DIAGNOSIS — T402X5A Adverse effect of other opioids, initial encounter: Secondary | ICD-10-CM | POA: Diagnosis present

## 2019-11-12 DIAGNOSIS — S0101XD Laceration without foreign body of scalp, subsequent encounter: Secondary | ICD-10-CM | POA: Diagnosis not present

## 2019-11-12 DIAGNOSIS — R011 Cardiac murmur, unspecified: Secondary | ICD-10-CM | POA: Diagnosis not present

## 2019-11-12 DIAGNOSIS — M87051 Idiopathic aseptic necrosis of right femur: Secondary | ICD-10-CM | POA: Diagnosis not present

## 2019-11-12 DIAGNOSIS — Z4789 Encounter for other orthopedic aftercare: Secondary | ICD-10-CM | POA: Diagnosis not present

## 2019-11-12 DIAGNOSIS — R531 Weakness: Secondary | ICD-10-CM | POA: Diagnosis not present

## 2019-11-12 DIAGNOSIS — R69 Illness, unspecified: Secondary | ICD-10-CM | POA: Diagnosis not present

## 2019-11-12 DIAGNOSIS — R269 Unspecified abnormalities of gait and mobility: Secondary | ICD-10-CM | POA: Diagnosis not present

## 2019-11-12 DIAGNOSIS — J188 Other pneumonia, unspecified organism: Secondary | ICD-10-CM | POA: Diagnosis not present

## 2019-11-12 DIAGNOSIS — G8918 Other acute postprocedural pain: Secondary | ICD-10-CM | POA: Diagnosis not present

## 2019-11-12 DIAGNOSIS — W19XXXD Unspecified fall, subsequent encounter: Secondary | ICD-10-CM | POA: Diagnosis not present

## 2019-11-12 DIAGNOSIS — D649 Anemia, unspecified: Secondary | ICD-10-CM | POA: Diagnosis not present

## 2019-11-12 DIAGNOSIS — M87059 Idiopathic aseptic necrosis of unspecified femur: Secondary | ICD-10-CM | POA: Diagnosis not present

## 2019-11-12 DIAGNOSIS — S72144D Nondisplaced intertrochanteric fracture of right femur, subsequent encounter for closed fracture with routine healing: Secondary | ICD-10-CM | POA: Diagnosis not present

## 2019-11-12 DIAGNOSIS — Z96649 Presence of unspecified artificial hip joint: Secondary | ICD-10-CM | POA: Diagnosis not present

## 2019-11-12 DIAGNOSIS — R262 Difficulty in walking, not elsewhere classified: Secondary | ICD-10-CM | POA: Diagnosis not present

## 2019-11-12 DIAGNOSIS — D72829 Elevated white blood cell count, unspecified: Secondary | ICD-10-CM | POA: Diagnosis not present

## 2019-11-12 DIAGNOSIS — Z7401 Bed confinement status: Secondary | ICD-10-CM | POA: Diagnosis not present

## 2019-11-12 DIAGNOSIS — M25561 Pain in right knee: Secondary | ICD-10-CM | POA: Diagnosis not present

## 2019-11-12 DIAGNOSIS — R05 Cough: Secondary | ICD-10-CM | POA: Diagnosis not present

## 2019-11-14 DIAGNOSIS — D649 Anemia, unspecified: Secondary | ICD-10-CM | POA: Diagnosis not present

## 2019-11-14 DIAGNOSIS — G8918 Other acute postprocedural pain: Secondary | ICD-10-CM | POA: Diagnosis not present

## 2019-11-14 DIAGNOSIS — Z96649 Presence of unspecified artificial hip joint: Secondary | ICD-10-CM | POA: Diagnosis not present

## 2019-11-14 DIAGNOSIS — R262 Difficulty in walking, not elsewhere classified: Secondary | ICD-10-CM | POA: Diagnosis not present

## 2019-11-30 DIAGNOSIS — E559 Vitamin D deficiency, unspecified: Secondary | ICD-10-CM | POA: Diagnosis not present

## 2019-11-30 DIAGNOSIS — M549 Dorsalgia, unspecified: Secondary | ICD-10-CM | POA: Diagnosis not present

## 2019-11-30 DIAGNOSIS — M170 Bilateral primary osteoarthritis of knee: Secondary | ICD-10-CM | POA: Diagnosis not present

## 2019-11-30 DIAGNOSIS — Z87310 Personal history of (healed) osteoporosis fracture: Secondary | ICD-10-CM | POA: Diagnosis not present

## 2019-11-30 DIAGNOSIS — E538 Deficiency of other specified B group vitamins: Secondary | ICD-10-CM | POA: Diagnosis not present

## 2019-11-30 DIAGNOSIS — Z79891 Long term (current) use of opiate analgesic: Secondary | ICD-10-CM | POA: Diagnosis not present

## 2019-11-30 DIAGNOSIS — F419 Anxiety disorder, unspecified: Secondary | ICD-10-CM | POA: Diagnosis not present

## 2019-11-30 DIAGNOSIS — E785 Hyperlipidemia, unspecified: Secondary | ICD-10-CM | POA: Diagnosis not present

## 2019-11-30 DIAGNOSIS — G4700 Insomnia, unspecified: Secondary | ICD-10-CM | POA: Diagnosis not present

## 2019-11-30 DIAGNOSIS — J019 Acute sinusitis, unspecified: Secondary | ICD-10-CM | POA: Diagnosis not present

## 2019-11-30 DIAGNOSIS — Z8701 Personal history of pneumonia (recurrent): Secondary | ICD-10-CM | POA: Diagnosis not present

## 2019-11-30 DIAGNOSIS — D510 Vitamin B12 deficiency anemia due to intrinsic factor deficiency: Secondary | ICD-10-CM | POA: Diagnosis not present

## 2019-11-30 DIAGNOSIS — E46 Unspecified protein-calorie malnutrition: Secondary | ICD-10-CM | POA: Diagnosis not present

## 2019-11-30 DIAGNOSIS — R634 Abnormal weight loss: Secondary | ICD-10-CM | POA: Diagnosis not present

## 2019-11-30 DIAGNOSIS — M87251 Osteonecrosis due to previous trauma, right femur: Secondary | ICD-10-CM | POA: Diagnosis not present

## 2019-11-30 DIAGNOSIS — K219 Gastro-esophageal reflux disease without esophagitis: Secondary | ICD-10-CM | POA: Diagnosis not present

## 2019-11-30 DIAGNOSIS — Z96641 Presence of right artificial hip joint: Secondary | ICD-10-CM | POA: Diagnosis not present

## 2019-11-30 DIAGNOSIS — J479 Bronchiectasis, uncomplicated: Secondary | ICD-10-CM | POA: Diagnosis not present

## 2019-11-30 DIAGNOSIS — M810 Age-related osteoporosis without current pathological fracture: Secondary | ICD-10-CM | POA: Diagnosis not present

## 2019-11-30 DIAGNOSIS — Z7982 Long term (current) use of aspirin: Secondary | ICD-10-CM | POA: Diagnosis not present

## 2019-11-30 DIAGNOSIS — T84498D Other mechanical complication of other internal orthopedic devices, implants and grafts, subsequent encounter: Secondary | ICD-10-CM | POA: Diagnosis not present

## 2019-12-01 DIAGNOSIS — T84498D Other mechanical complication of other internal orthopedic devices, implants and grafts, subsequent encounter: Secondary | ICD-10-CM | POA: Diagnosis not present

## 2019-12-01 DIAGNOSIS — R634 Abnormal weight loss: Secondary | ICD-10-CM | POA: Diagnosis not present

## 2019-12-01 DIAGNOSIS — M87251 Osteonecrosis due to previous trauma, right femur: Secondary | ICD-10-CM | POA: Diagnosis not present

## 2019-12-01 DIAGNOSIS — J479 Bronchiectasis, uncomplicated: Secondary | ICD-10-CM | POA: Diagnosis not present

## 2019-12-01 DIAGNOSIS — Z79899 Other long term (current) drug therapy: Secondary | ICD-10-CM | POA: Diagnosis not present

## 2019-12-01 DIAGNOSIS — M810 Age-related osteoporosis without current pathological fracture: Secondary | ICD-10-CM | POA: Diagnosis not present

## 2019-12-01 DIAGNOSIS — E559 Vitamin D deficiency, unspecified: Secondary | ICD-10-CM | POA: Diagnosis not present

## 2019-12-01 DIAGNOSIS — D510 Vitamin B12 deficiency anemia due to intrinsic factor deficiency: Secondary | ICD-10-CM | POA: Diagnosis not present

## 2019-12-07 ENCOUNTER — Other Ambulatory Visit: Payer: Self-pay | Admitting: *Deleted

## 2019-12-07 DIAGNOSIS — I10 Essential (primary) hypertension: Secondary | ICD-10-CM

## 2019-12-07 NOTE — Patient Outreach (Signed)
THN Post- Acute Care Coordinator follow up  Verified in Patient [place] that Ms. Weiler transitioned from [facility] to home with [facility] on 11/27/19.   Ms. Colo was active with [facility] prior to admission.  Will make referral to [facility] for care coordination and complex case management.   Marthenia Rolling, MSN-Ed, RN,BSN [place] Acute Care Coordinator [phone] [facility]) [phone]  (Toll free office)

## 2019-12-08 DIAGNOSIS — M87251 Osteonecrosis due to previous trauma, right femur: Secondary | ICD-10-CM | POA: Diagnosis not present

## 2019-12-08 DIAGNOSIS — E559 Vitamin D deficiency, unspecified: Secondary | ICD-10-CM | POA: Diagnosis not present

## 2019-12-08 DIAGNOSIS — J479 Bronchiectasis, uncomplicated: Secondary | ICD-10-CM | POA: Diagnosis not present

## 2019-12-08 DIAGNOSIS — M810 Age-related osteoporosis without current pathological fracture: Secondary | ICD-10-CM | POA: Diagnosis not present

## 2019-12-08 DIAGNOSIS — T84498D Other mechanical complication of other internal orthopedic devices, implants and grafts, subsequent encounter: Secondary | ICD-10-CM | POA: Diagnosis not present

## 2019-12-08 DIAGNOSIS — D510 Vitamin B12 deficiency anemia due to intrinsic factor deficiency: Secondary | ICD-10-CM | POA: Diagnosis not present

## 2019-12-09 ENCOUNTER — Other Ambulatory Visit: Payer: Self-pay | Admitting: *Deleted

## 2019-12-09 ENCOUNTER — Encounter: Payer: Self-pay | Admitting: *Deleted

## 2019-12-09 NOTE — Patient Outreach (Signed)
[facility]) Care Management [place], Transition of Care day # 1 Post- SNF discharge day # 12  12/09/2019  Keya Jezewski 02-22-1936 [id number]  Successful telephone outreach to [name], 84 y/o female referred to [facility] RN CM 12/07/19 by [facility] RN PAC after patient experienced mechanical fall with hip fracture with ORIF on August 24, 2019; patient was discharged to SNF for rehabilitation and subsequently experienced second hospital for revision of ORIF.  She was subsequently discharged from [facility] on November 27, 2019 to home/ self-care with home health services through [facility].  Patient has history including, but not limited to, arthritis; GERD; anxiety.  HIPAA/ identity verified and [facility] CM services discussed with patient who provides verbal consent for [facility] CM involvement in her care post- SNF discharge.  Patient reports "doing good" after recent SNF discharge and she denies pain, new/ recent falls and sounds to be in no distress throughout phone call today.  Patient further reports;  Medications: -- Has all medicationsand takes as prescribed;denies concerns/ questions about current medications -- Verbalizes good general understanding of the purpose, dosing, and scheduling of medications  -- self-manage medications -- denies issues with swallowing medications -- patient was recently discharged from the hospital and all medications were thoroughly reviewed with patient today; no concerns aorund medications identified as a result of medication review  Home health [facility]) services: -- [facility] services for PT in place through [facility] -- confirms that [facility] services have begun; endorses active participation with PT- this was encouraged   Provider appointments: -- All recent/ upcoming provider appointments were reviewed with patient today who verbalizes accurate understanding of all along with plans to attend all as scheduled  Safety/ Mobility/  Falls: -- denies new/ recent falls; reports fall that occurred in January and led to hip fracture as her "only" fall in the past  -- assistive devices: uses cane; reports no other need for assistive devices post- SNF discharge  -- general fall risks/ prevention education discussed/ reviewed with patient today  Social/ Community Resource needs: -- currently denies community resource needs, stating supportive family members that assist with care needs as indicated -- independent in ADL's/ iADL's; normally drives self to appointments; hopes to resume driving soon -- normally lives alone; grandson is temporarily living with patient and sister lives close by -- family provides transportation for patient to all provider appointments, errands, etc -- SDOH completed for: depression/ transportation/ food insecurity and no needs/ concerns were identified  Advanced Directive (AD) Planning:   --reports currently has exisisting AD in place for HCPOA and Living Will; denies desire to make changes; endorses self as "limited" code; states "depends on circumstances"  Patient denies further issues, concerns, or problems today.  I provided/ confirmed that patient has my direct phone number, the main [facility] CM office phone number, and the [facility] CM 24-hour nurse advice phone number should issues arise prior to next scheduled THN CM outreach by phone next week.  Encouraged patient to contact me directly if needs, questions, issues, or concerns arise prior to next scheduled outreach; patient agreed to do so.  Plan:  Patient will take medications as prescribed and will attend all scheduled provider appointments  Patient will promptly notify care providers for any new concerns/ issues/ problems that arise  Patient will actively participate in home health services as ordered post-recent SNF discharge  I will make patien't PCP aware of [place] RN CM involvement in patient's care-- will send barriers  letter  [facility] CM  outreach for ongoing transition of care to continue with scheduled phone call next week   [facility] CM Care Plan Problem One     Most Recent Value  Care Plan Problem One  High Risk for hospital readmissions related to/ as evidenced by two recent hospitalizations and SNF rehabilitation visits  Role Documenting the Problem One  Care Management [place] for Problem One  Active  THN Long Term Goal   Over the next 31 days, patient will not experience hospital readmission as evidenced by patient reporting and review of EMR during [facility] RN CM outreach  [facility] Long Term Goal Start Date  12/09/19  Interventions for Problem One Long Term Goal  Discussed with patient her current clinical condition and progress post- recent SNF discharge,  THN CM transition of care program initiated,  medication review completed,  post- SNF discharge instructions reviewed with patient,  SDOH assessment for depression, transportation, and food insecurity completed with no concerns identified  THN CM Short Term Goal #1   Over the next 30 days, patient will continue to actively participate in home health services post- SNF discharge as evidenced by patient reporting and collaboration with home health team as indicated during [facility] RN CM outreach  [facility] CM Short Term Goal #1 Start Date  12/09/19  Interventions for Short Term Goal #1  Confirmed with patient that home health services are active and that patient has contact information for home health team,  encouraged patient's ongoing active particpation in all home health disciplines  [facility] CM Short Term Goal #2   Over the next 30 days, patient will attend all scheduled provider appointments as evidenced by patient reporting and collaboration with care providers as indicated during [facility] RN CM outreach  [facility] CM Short Term Goal #2 Start Date  12/09/19  Interventions for Short Term Goal #2  Reviewed recent and upcoming provider appointments with patient and confirmed that she has accurate  understanding of all, along with plans to attend, and has reliable transportation through family memebers     I appreciate the opportunity to participate in Danyela's care,  [name], RN, BSN, [employer] Coordinator [facility] Care Management  [phone]

## 2019-12-10 DIAGNOSIS — M87251 Osteonecrosis due to previous trauma, right femur: Secondary | ICD-10-CM | POA: Diagnosis not present

## 2019-12-10 DIAGNOSIS — E559 Vitamin D deficiency, unspecified: Secondary | ICD-10-CM | POA: Diagnosis not present

## 2019-12-10 DIAGNOSIS — M810 Age-related osteoporosis without current pathological fracture: Secondary | ICD-10-CM | POA: Diagnosis not present

## 2019-12-10 DIAGNOSIS — T84498D Other mechanical complication of other internal orthopedic devices, implants and grafts, subsequent encounter: Secondary | ICD-10-CM | POA: Diagnosis not present

## 2019-12-10 DIAGNOSIS — D510 Vitamin B12 deficiency anemia due to intrinsic factor deficiency: Secondary | ICD-10-CM | POA: Diagnosis not present

## 2019-12-10 DIAGNOSIS — J479 Bronchiectasis, uncomplicated: Secondary | ICD-10-CM | POA: Diagnosis not present

## 2019-12-14 DIAGNOSIS — M87251 Osteonecrosis due to previous trauma, right femur: Secondary | ICD-10-CM | POA: Diagnosis not present

## 2019-12-14 DIAGNOSIS — M810 Age-related osteoporosis without current pathological fracture: Secondary | ICD-10-CM | POA: Diagnosis not present

## 2019-12-14 DIAGNOSIS — T84498D Other mechanical complication of other internal orthopedic devices, implants and grafts, subsequent encounter: Secondary | ICD-10-CM | POA: Diagnosis not present

## 2019-12-14 DIAGNOSIS — E559 Vitamin D deficiency, unspecified: Secondary | ICD-10-CM | POA: Diagnosis not present

## 2019-12-14 DIAGNOSIS — D510 Vitamin B12 deficiency anemia due to intrinsic factor deficiency: Secondary | ICD-10-CM | POA: Diagnosis not present

## 2019-12-14 DIAGNOSIS — J479 Bronchiectasis, uncomplicated: Secondary | ICD-10-CM | POA: Diagnosis not present

## 2019-12-15 ENCOUNTER — Encounter: Payer: Self-pay | Admitting: *Deleted

## 2019-12-15 ENCOUNTER — Other Ambulatory Visit: Payer: Self-pay | Admitting: *Deleted

## 2019-12-15 NOTE — Patient Outreach (Signed)
[facility]) Care Management THN CM Telephone Outreach, Transition of Care day # 7 Post- SNF discharge day # 18 12/15/2019  Jasmin Anderson 02/16/1936 [id number]   Successful telephone outreach to Jasmin Anderson, 84 y/o female referred to [place] 12/07/19 by [facility] RN PAC after patient experienced mechanical fall with hip fracture with ORIF on August 24, 2019; patient was discharged to SNF for rehabilitation and subsequently experienced second hospital admission for revision of original ORIF.  She was subsequently discharged from [facility] on November 27, 2019 to home/ self-care with home health services through [facility].  Patient has history including, but not limited to, arthritis; GERD; anxiety.  HIPAA/ identity verified and patient denies pain, new/ recent falls, and clinical concerns; she reports "doing absolutely great" and sounds to be in no distress throughout phone call today.  Patient further reports:  -- no concerns/ recent changes to medications; continues to self-manage -- does not obtain flu "or any" vaccine-- states "does not believe" in getting vaccines -- home health services for PT continue: reviewed recent home PT visits with patient who reports she is "doing great" and making "great progress;" states she ambulated with walker/ assistance of home health PT "6 times around the house" at time of last visit- reports PT told her he was "impressed." -- upcoming scheduled post-operative provider office visit with orthopedic surgeon; plans to attend as scheduled; independently verbalizes that she has plans this summer to schedule vision and dentistry appointments, as "it has been several years;" this was encouraged; confirmed she continues having reliable transportation through family members-- again states she is hopeful that she will be able to resume driving "soon." ---- confirmed that patient understands to contact care provider promptly for any concerns/ issues/ problems  that arise and that she has all contact information for care providers -- continues making progress in ambulation and "feels very steady" with ambulation; continues using walker for ambulation, and is hopeful that she will gradually progress and move to using cane more; fall prevention/ risks reviewed with patient, who verbalizes good understanding of same  Patient denies further issues, concerns, or problems today. I confirmed that patient hasmy direct phone number, the main [facility] CM office phone number, and the [facility] CM 24-hour nurse advice phone number should issues arise prior to next scheduled THN CM outreach by phone next week.  Encouraged patient to contact me directly if needs, questions, issues, or concerns arise prior to next scheduled outreach; patient agreed to do so.  Plan:  Patient will take medications as prescribed and will attend all scheduled provider appointments  Patient will promptly notify care providers for any new concerns/ issues/ problems that arise  Patient will actively participate in home health services as ordered post-recent SNF discharge  I will share today's [facility] CM note/ care plan with patient's PCP as [facility] CM initial assessment  THN CM outreach for ongoing transition of care to continue with scheduled phone call next week  [facility] CM Care Plan Problem One     Most Recent Value  Care Plan Problem One  High Risk for hospital readmissions related to/ as evidenced by two recent hospitalizations and SNF rehabilitation visits  Role Documenting the Problem One  Care Management [place] for Problem One  Active  THN Long Term Goal   Over the next 31 days, patient will not experience hospital readmission as evidenced by patient reporting and review of EMR during [facility] RN CM outreach  [place]  Goal Start Date  12/09/19  Interventions for Problem One Long Term Goal  Discussed with patient her current clinical condition and confirmed that she does not  have any current clinical concerns,  completed THN CM initial assessment and shared with patient's PCP  THN CM Short Term Goal #1   Over the next 30 days, patient will continue to actively participate in home health services post- SNF discharge as evidenced by patient reporting and collaboration with home health team as indicated during [facility] RN CM outreach  [facility] CM Short Term Goal #1 Start Date  12/09/19  Interventions for Short Term Goal #1  Confirmed that patient continues actively participating in home health PT services and reviewed recent home health visits with patient  THN CM Short Term Goal #2   Over the next 30 days, patient will attend all scheduled provider appointments as evidenced by patient reporting and collaboration with care providers as indicated during [facility] RN CM outreach  [facility] CM Short Term Goal #2 Start Date  12/09/19  Interventions for Short Term Goal #2  Reviewed with patient upcoming provider appintments and confirmed that she has ongoing reliable transportation,  confirmed that patient has all contact phone numbers for care providers and understands to promptly notify care providers should new concerns/ issues/ problems arise    [name], RN, BSN, [place] Coordinator [facility] Care Management  [phone]

## 2019-12-16 DIAGNOSIS — D510 Vitamin B12 deficiency anemia due to intrinsic factor deficiency: Secondary | ICD-10-CM | POA: Diagnosis not present

## 2019-12-16 DIAGNOSIS — M810 Age-related osteoporosis without current pathological fracture: Secondary | ICD-10-CM | POA: Diagnosis not present

## 2019-12-18 DIAGNOSIS — D510 Vitamin B12 deficiency anemia due to intrinsic factor deficiency: Secondary | ICD-10-CM | POA: Diagnosis not present

## 2019-12-18 DIAGNOSIS — T84498D Other mechanical complication of other internal orthopedic devices, implants and grafts, subsequent encounter: Secondary | ICD-10-CM | POA: Diagnosis not present

## 2019-12-18 DIAGNOSIS — M87251 Osteonecrosis due to previous trauma, right femur: Secondary | ICD-10-CM | POA: Diagnosis not present

## 2019-12-18 DIAGNOSIS — M810 Age-related osteoporosis without current pathological fracture: Secondary | ICD-10-CM | POA: Diagnosis not present

## 2019-12-18 DIAGNOSIS — E559 Vitamin D deficiency, unspecified: Secondary | ICD-10-CM | POA: Diagnosis not present

## 2019-12-18 DIAGNOSIS — J479 Bronchiectasis, uncomplicated: Secondary | ICD-10-CM | POA: Diagnosis not present

## 2019-12-23 ENCOUNTER — Encounter: Payer: Self-pay | Admitting: *Deleted

## 2019-12-23 ENCOUNTER — Other Ambulatory Visit: Payer: Self-pay | Admitting: *Deleted

## 2019-12-23 NOTE — Patient Outreach (Signed)
[place] [facility]) Care Management THN CM Telephone Outreach, Transition of Care day 15 Post- SNF discharge day # 26 12/23/2019  Jasmin Anderson [birth year] [id number]  Successful telephone outreach to Jasmin Anderson, 84 y/o female referred to [place] 12/07/19 by [facility] RN PAC after patient experiencedmechanical fall with hip fracture with ORIF on August 24, 2019; patient was discharged to SNF for rehabilitation and subsequently experienced second hospital admission for revision of original ORIF. She was subsequently discharged from [facility] on November 27, 2019 to home/ self-care with home health services through [facility]. Patient has history including, but not limited to, arthritis; GERD; anxiety.  HIPAA/ identity verified and patient denies pain, new/ recent falls, and clinical concerns; she reports "doing great" and sounds to be in no distress throughout phone call today.  Patient further reports:  -- no concerns/ recent changes to medications; continues to self-manage; medications reviewed with patient and updated in EHR according to patient report -- home health services for PT continue: reviewed recent home PT visits with patient who reports she continues to do "great;" continues using walker for "95%" of all ambulation at home; fall prevention discussed/ reviewed  -- upcoming scheduled post-operative provider office visit with orthopedic surgeon; today shew tells me that she has misplaced the appointment card and is not sure when this appointment is; offered to place care coordination outreach to surgeon, however, patient states she is capable of making call herself independently- provided surgeon's number to patient and encouraged her to confirm or re-schedule appointment, as indicated- she is agreeable ---- discussed need for health maintenance provider visits, vision/ dental, etc and patient reports she will do soon  -- continues making progress in ambulation and "feels very  steady" with ambulation and continues able to care for her numerous dogs at her home; continues using walker for ambulation, and is hopeful that she will gradually progress and move to using cane; again reports she is hoping to resume driving soon  Patient denies further issues, concerns, or problems today. Iconfirmed that patient hasmy direct phone number, the main THN CM office phone number, and the [facility] CM 24-hour nurse advice phone number should issues arise prior to next scheduled THN CM outreachby phone next week. Encouraged patient to contact me directly if needs, questions, issues, or concerns arise prior to next scheduled outreach; patient agreed to do so.  Plan:  Patient will take medications as prescribed and will attend all scheduled provider appointments  Patient will promptly notify care providers for any new concerns/ issues/ problems that arise  Patient will actively participate in home health services as ordered post-recent SNFdischarge  THN CM outreachfor ongoing transition of careto continue with scheduled phone call next week  [facility] CM Care Plan Problem One     Most Recent Value  Care Plan Problem One  High Risk for hospital readmissions related to/ as evidenced by two recent hospitalizations and SNF rehabilitation visits  Role Documenting the Problem One  Care Management [place] for Problem One  Active  THN Long Term Goal   Over the next 31 days, patient will not experience hospital readmission as evidenced by patient reporting and review of EMR during [facility] RN CM outreach  [facility] Long Term Goal Start Date  12/09/19  Interventions for Problem One Long Term Goal  Transition of care call completed,  confirmed that patient has no clinical concerns and has had no recent falls,  updated medication list and confirmed that patient has no  current medication concerns and continues self-managing medications  THN CM Short Term Goal #1   Over the next 30 days,  patient will continue to actively participate in home health services post- SNF discharge as evidenced by patient reporting and collaboration with home health team as indicated during [facility] RN CM outreach  [facility] CM Short Term Goal #1 Start Date  12/09/19  Interventions for Short Term Goal #1  Reviewed recent home health PT visits and confirmed that patient continues actively participating in home health PT,  positive reinforcement provided,  patient was encouraged to continue active participation with home health PT  [facility] CM Short Term Goal #2   Over the next 30 days, patient will attend all scheduled provider appointments as evidenced by patient reporting and collaboration with care providers as indicated during [facility] RN CM outreach  [facility] CM Short Term Goal #2 Start Date  12/09/19  Interventions for Short Term Goal #2  Confirmed that patient has had no recent provider appointments,  enocuraged her to make/ confirm next office visit with orthopedic provider and provided surgeon office number to patient,  discussed need for scheduled health maintenance appointments to be scheduled, including vision and dental and encouraged patient to scheduled these appointments promptly     [name], RN, BSN, [employer] Coordinator [facility] Care Management  [phone]

## 2019-12-24 DIAGNOSIS — M810 Age-related osteoporosis without current pathological fracture: Secondary | ICD-10-CM | POA: Diagnosis not present

## 2019-12-24 DIAGNOSIS — D510 Vitamin B12 deficiency anemia due to intrinsic factor deficiency: Secondary | ICD-10-CM | POA: Diagnosis not present

## 2019-12-24 DIAGNOSIS — M87251 Osteonecrosis due to previous trauma, right femur: Secondary | ICD-10-CM | POA: Diagnosis not present

## 2019-12-24 DIAGNOSIS — T84498D Other mechanical complication of other internal orthopedic devices, implants and grafts, subsequent encounter: Secondary | ICD-10-CM | POA: Diagnosis not present

## 2019-12-24 DIAGNOSIS — E559 Vitamin D deficiency, unspecified: Secondary | ICD-10-CM | POA: Diagnosis not present

## 2019-12-24 DIAGNOSIS — J479 Bronchiectasis, uncomplicated: Secondary | ICD-10-CM | POA: Diagnosis not present

## 2019-12-25 DIAGNOSIS — M87251 Osteonecrosis due to previous trauma, right femur: Secondary | ICD-10-CM | POA: Diagnosis not present

## 2019-12-25 DIAGNOSIS — D510 Vitamin B12 deficiency anemia due to intrinsic factor deficiency: Secondary | ICD-10-CM | POA: Diagnosis not present

## 2019-12-25 DIAGNOSIS — T84498D Other mechanical complication of other internal orthopedic devices, implants and grafts, subsequent encounter: Secondary | ICD-10-CM | POA: Diagnosis not present

## 2019-12-25 DIAGNOSIS — E559 Vitamin D deficiency, unspecified: Secondary | ICD-10-CM | POA: Diagnosis not present

## 2019-12-25 DIAGNOSIS — J479 Bronchiectasis, uncomplicated: Secondary | ICD-10-CM | POA: Diagnosis not present

## 2019-12-25 DIAGNOSIS — M810 Age-related osteoporosis without current pathological fracture: Secondary | ICD-10-CM | POA: Diagnosis not present

## 2019-12-29 DIAGNOSIS — M87251 Osteonecrosis due to previous trauma, right femur: Secondary | ICD-10-CM | POA: Diagnosis not present

## 2019-12-29 DIAGNOSIS — M810 Age-related osteoporosis without current pathological fracture: Secondary | ICD-10-CM | POA: Diagnosis not present

## 2019-12-29 DIAGNOSIS — E559 Vitamin D deficiency, unspecified: Secondary | ICD-10-CM | POA: Diagnosis not present

## 2019-12-29 DIAGNOSIS — T84498D Other mechanical complication of other internal orthopedic devices, implants and grafts, subsequent encounter: Secondary | ICD-10-CM | POA: Diagnosis not present

## 2019-12-29 DIAGNOSIS — D510 Vitamin B12 deficiency anemia due to intrinsic factor deficiency: Secondary | ICD-10-CM | POA: Diagnosis not present

## 2019-12-29 DIAGNOSIS — J479 Bronchiectasis, uncomplicated: Secondary | ICD-10-CM | POA: Diagnosis not present

## 2019-12-30 DIAGNOSIS — J019 Acute sinusitis, unspecified: Secondary | ICD-10-CM | POA: Diagnosis not present

## 2019-12-30 DIAGNOSIS — Z7982 Long term (current) use of aspirin: Secondary | ICD-10-CM | POA: Diagnosis not present

## 2019-12-30 DIAGNOSIS — E538 Deficiency of other specified B group vitamins: Secondary | ICD-10-CM | POA: Diagnosis not present

## 2019-12-30 DIAGNOSIS — E46 Unspecified protein-calorie malnutrition: Secondary | ICD-10-CM | POA: Diagnosis not present

## 2019-12-30 DIAGNOSIS — F419 Anxiety disorder, unspecified: Secondary | ICD-10-CM | POA: Diagnosis not present

## 2019-12-30 DIAGNOSIS — M87251 Osteonecrosis due to previous trauma, right femur: Secondary | ICD-10-CM | POA: Diagnosis not present

## 2019-12-30 DIAGNOSIS — E559 Vitamin D deficiency, unspecified: Secondary | ICD-10-CM | POA: Diagnosis not present

## 2019-12-30 DIAGNOSIS — Z87310 Personal history of (healed) osteoporosis fracture: Secondary | ICD-10-CM | POA: Diagnosis not present

## 2019-12-30 DIAGNOSIS — Z79891 Long term (current) use of opiate analgesic: Secondary | ICD-10-CM | POA: Diagnosis not present

## 2019-12-30 DIAGNOSIS — M810 Age-related osteoporosis without current pathological fracture: Secondary | ICD-10-CM | POA: Diagnosis not present

## 2019-12-30 DIAGNOSIS — E785 Hyperlipidemia, unspecified: Secondary | ICD-10-CM | POA: Diagnosis not present

## 2019-12-30 DIAGNOSIS — Z96641 Presence of right artificial hip joint: Secondary | ICD-10-CM | POA: Diagnosis not present

## 2019-12-30 DIAGNOSIS — M549 Dorsalgia, unspecified: Secondary | ICD-10-CM | POA: Diagnosis not present

## 2019-12-30 DIAGNOSIS — R634 Abnormal weight loss: Secondary | ICD-10-CM | POA: Diagnosis not present

## 2019-12-30 DIAGNOSIS — J479 Bronchiectasis, uncomplicated: Secondary | ICD-10-CM | POA: Diagnosis not present

## 2019-12-30 DIAGNOSIS — G4700 Insomnia, unspecified: Secondary | ICD-10-CM | POA: Diagnosis not present

## 2019-12-30 DIAGNOSIS — D510 Vitamin B12 deficiency anemia due to intrinsic factor deficiency: Secondary | ICD-10-CM | POA: Diagnosis not present

## 2019-12-30 DIAGNOSIS — Z8701 Personal history of pneumonia (recurrent): Secondary | ICD-10-CM | POA: Diagnosis not present

## 2019-12-30 DIAGNOSIS — M170 Bilateral primary osteoarthritis of knee: Secondary | ICD-10-CM | POA: Diagnosis not present

## 2019-12-30 DIAGNOSIS — K219 Gastro-esophageal reflux disease without esophagitis: Secondary | ICD-10-CM | POA: Diagnosis not present

## 2019-12-30 DIAGNOSIS — T84498D Other mechanical complication of other internal orthopedic devices, implants and grafts, subsequent encounter: Secondary | ICD-10-CM | POA: Diagnosis not present

## 2020-01-01 ENCOUNTER — Other Ambulatory Visit: Payer: Self-pay | Admitting: *Deleted

## 2020-01-01 ENCOUNTER — Encounter: Payer: Self-pay | Admitting: *Deleted

## 2020-01-01 NOTE — Patient Outreach (Signed)
[place] [facility]) Care Management THN CM Telephone Outreach, Transition of Care day # 24 Post- SNF discharge day # 35  01/01/2020  Jasmin Anderson [birth year] [id number]  Successful telephone outreach to Jasmin Anderson, 84 y/o female referred to [place] 12/07/19 by [facility] RN PAC after patient experiencedmechanical fall with hip fracture with ORIF on August 24, 2019; patient was discharged to SNF for rehabilitation and subsequently experienced second hospitaladmissionfor revision of originalORIF. She was subsequently discharged from [facility] on April 30, 2021to home/ self-care with [facility]. Patient has history including, but not limited to, arthritis; GERD; anxiety.  HIPAA/ identity verifiedand patient denies pain, new/ recent falls, and clinical concerns; she reports "still doing great and feeling good" and sounds to be in no distress throughout phone call today.  Patient further reports:  -- no concerns/ recent changes to medications; continues to self-manage;  -- home health services for PT continue: reviewed recent home PT visits with patient who reports she continues to do "great;" continues using walker for "95%" of all ambulation at home; reports making progress with home health PT and is able to walk greater distances; continues able to care for her dogs who are outside; reports "learned to be very careful."  Continues using walker for ambulation.  Previously provided fall risk/ prevention education reiterated discussed/ reviewed  -- upcoming scheduled post-operative provider office visits with orthopedic surgeon and with PCP on January 08, 2020; reports daughter to transport; agreed to call for final Transition of Care call in afternoon of January 08, 2020 after appointments   Patient denies further issues, concerns, or problems today. Iconfirmed that patient hasmy direct phone number, the main THN CM office phone number, and the [facility] CM 24-hour  nurse advice phone number should issues arise prior to next scheduled THN CM outreachby phone next week. Encouraged patient to contact me directly if needs, questions, issues, or concerns arise prior to next scheduled outreach; patient agreed to do so.  Plan:  Patient will take medications as prescribed and will attend all scheduled provider appointments  Patient will promptly notify care providers for any new concerns/ issues/ problems that arise  Patient will actively participate in home health services as ordered post-recent SNFdischarge  THN CM outreachfor ongoing transition of careto continue with scheduled phone call next week  [facility] CM Care Plan Problem One     Most Recent Value  Care Plan Problem One  High Risk for hospital readmissions related to/ as evidenced by two recent hospitalizations and SNF rehabilitation visits  Role Documenting the Problem One  Care Management [place] for Problem One  Active  THN Long Term Goal   Over the next 31 days, patient will not experience hospital readmission as evidenced by patient reporting and review of EMR during [facility] RN CM outreach  [facility] Long Term Goal Start Date  12/09/19  Interventions for Problem One Long Term Goal  Completed TOC call and confirmed that patient is not having any new concerns and is doing well and continues using walker for ambulation, with no new/ recent falls,  confirmed that she continues self-managing medications and verbalizes no convcerns aorund medications and reports taking as prescribed  THN CM Short Term Goal #1   Over the next 30 days, patient will continue to actively participate in home health services post- SNF discharge as evidenced by patient reporting and collaboration with home health team as indicated during [facility] RN CM outreach  [facility] CM  Short Term Goal #1 Start Date  12/09/19  Interventions for Short Term Goal #1  Confirmed that patient continues actively participating in home health and  reviewed this week's visits with patient,  positive reinforcement provided for progress with PT that patient describes  THN CM Short Term Goal #2   Over the next 30 days, patient will attend all scheduled provider appointments as evidenced by patient reporting and collaboration with care providers as indicated during [facility] RN CM outreach  [facility] CM Short Term Goal #2 Start Date  12/09/19  Interventions for Short Term Goal #2  Confirmed that patient has scheduled SNF follow up appointments with surgeon and PCP and has plans to attend as scheduled, with stable transportation through family members     [name], RN, BSN, [employer] Coordinator [facility] Care Management  [phone]

## 2020-01-07 DIAGNOSIS — T84498D Other mechanical complication of other internal orthopedic devices, implants and grafts, subsequent encounter: Secondary | ICD-10-CM | POA: Diagnosis not present

## 2020-01-07 DIAGNOSIS — M87251 Osteonecrosis due to previous trauma, right femur: Secondary | ICD-10-CM | POA: Diagnosis not present

## 2020-01-07 DIAGNOSIS — E559 Vitamin D deficiency, unspecified: Secondary | ICD-10-CM | POA: Diagnosis not present

## 2020-01-07 DIAGNOSIS — M810 Age-related osteoporosis without current pathological fracture: Secondary | ICD-10-CM | POA: Diagnosis not present

## 2020-01-07 DIAGNOSIS — D510 Vitamin B12 deficiency anemia due to intrinsic factor deficiency: Secondary | ICD-10-CM | POA: Diagnosis not present

## 2020-01-07 DIAGNOSIS — J479 Bronchiectasis, uncomplicated: Secondary | ICD-10-CM | POA: Diagnosis not present

## 2020-01-08 ENCOUNTER — Other Ambulatory Visit: Payer: Self-pay | Admitting: *Deleted

## 2020-01-08 ENCOUNTER — Encounter: Payer: Self-pay | Admitting: *Deleted

## 2020-01-08 DIAGNOSIS — J479 Bronchiectasis, uncomplicated: Secondary | ICD-10-CM | POA: Diagnosis not present

## 2020-01-08 DIAGNOSIS — M810 Age-related osteoporosis without current pathological fracture: Secondary | ICD-10-CM | POA: Diagnosis not present

## 2020-01-08 DIAGNOSIS — S72001D Fracture of unspecified part of neck of right femur, subsequent encounter for closed fracture with routine healing: Secondary | ICD-10-CM | POA: Diagnosis not present

## 2020-01-08 DIAGNOSIS — D510 Vitamin B12 deficiency anemia due to intrinsic factor deficiency: Secondary | ICD-10-CM | POA: Diagnosis not present

## 2020-01-08 DIAGNOSIS — S72009A Fracture of unspecified part of neck of unspecified femur, initial encounter for closed fracture: Secondary | ICD-10-CM | POA: Diagnosis not present

## 2020-01-08 DIAGNOSIS — R634 Abnormal weight loss: Secondary | ICD-10-CM | POA: Diagnosis not present

## 2020-01-08 NOTE — Patient Outreach (Signed)
[place] [facility]) Care Management THN CM Telephone Outreach, Transition of Care day # 31 Post- SNF discharge day # 42 without hospital re-admission 01/08/2020  Jasmin Anderson Mar 06, 1936 [id number]  Successful telephone outreach to Jasmin Anderson, 84 y/o female referred to [facility] RN CM 12/07/19 by [facility] RN PAC after patient experiencedmechanical fall with hip fracture with ORIF on August 24, 2019; patient was discharged to SNF for rehabilitation and subsequently experienced second hospitaladmissionfor revision of originalORIF. She was subsequently discharged from [facility] on April 30, 2021to home/ self-care with home health services through [facility]. Patient has history including, but not limited to, arthritis; GERD; anxiety.  HIPAA/ identity verifiedand patient denies pain, new/ recent falls, and clinical concerns; she reports "still doing great and feeling good" and sounds to be in no distress throughout phone call today.  Patient further reports:  -- attended provider appointments today as scheduled to PCP and orthopedic surgeon; reports "got great report;" states blood work and X-Rays were taken which "all turned out normal." -- no changes to overall plan of care/ medications post- provider office visits today -- home health services for PT continue; reports "going great;" reviewed yesterday's visit with patient who reports she continues to make "good progress;" continues using cane/ walker as indicated for fall prevention; previously provided education around fall prevention reiterated with patient today, using teach back method -- continues independent in all self-care; continues able to care for her many dogs who live outside -- family continues providing transportation to all provider appointments  Patient denies further issues, concerns, or problems today. Iconfirmed/ re-provided that patient hasmy direct phone number, the main THN CM office phone number, and the [facility] CM  24-hour nurse advice phone number should issues arise prior to next scheduled THN CM outreachby phone next month. Encouraged patient to contact me directly if needs, questions, issues, or concerns arise prior to next scheduled outreach; patient agreed to do so.  Plan:  Patient will take medications as prescribed and will attend all scheduled provider appointments  Patient will promptly notify care providers for any new concerns/ issues/ problems that arise  Patient will actively participate in home health services as ordered post-recent SNFdischarge  Patient will continue to use assistive devices for fall prevention  THN CM outreachfor ongoing transition of careto continue with scheduled phone call next week  [facility] CM Care Plan Problem One     Most Recent Value  Care Plan Problem One High Risk for hospital readmissions related to/ as evidenced by two recent hospitalizations and SNF rehabilitation visits  Role Documenting the Problem One Care Management [place] for Problem One Not Active  THN Long Term Goal  Over the next 31 days, patient will not experience hospital readmission as evidenced by patient reporting and review of EMR during [facility] RN CM outreach  [facility] Long Term Goal Start Date 12/09/19  [facility] Long Term Goal Met Date 01/08/20  [Goal Met]  Interventions for Problem One Long Term Goal Discussed current clinical condition with patient and confirmed that she has no current clinical concerns,  confirmed that she has had no recent or new falls and has no medication concerns and continues caring for self independently  THN CM Short Term Goal #1  Over the next 30 days, patient will continue to actively participate in home health services post- SNF discharge as evidenced by patient reporting and collaboration with home health team as indicated during [facility] RN CM outreach  [facility] CM Short Term Goal #1  Start Date 12/09/19  THN CM Short Term Goal #1 Met Date 01/08/20  [Goal Met]    Interventions for Short Term Goal #1 Confirmed that home health services remain active and patient endorses ongoing active participation with home health services,  reviewed recent home health visits with patient  THN CM Short Term Goal #2  Over the next 30 days, patient will attend all scheduled provider appointments as evidenced by patient reporting and collaboration with care providers as indicated during [facility] RN CM outreach  [facility] CM Short Term Goal #2 Start Date 12/09/19  [facility] CM Short Term Goal #2 Met Date 01/08/20  [Goal Met]  Interventions for Short Term Goal #2 Confirmed that patient attended surgical and PCP provider appointments today and reviewed both with patient,  confirmed that no changes were made to medications or overall plan of care post-provider office visits today    [facility] CM Care Plan Problem Two     Most Recent Value  Care Plan Problem Two High Fall risk related to/ as evidenced by recent fall with injury/ patient reporting  Role Documenting the Problem Two Care Management [place] for Problem Two Active  Interventions for Problem Two Long Term Goal  Confirmed that patient has not had any new/ recent falls and continues using assistive devices as instructed,  encouraged her ongoing engagement with home health PT,  using teach back method, reiterated previously provided education around fall prevention and use of assistive devices  THN Long Term Goal Over the next 60 days, patient will not experience any new falls, as evidenced by patient reporting during [facility] RN CM outreach  [facility] Long Term Goal Start Date 01/08/20     [name], RN, BSN, [place] Coordinator [facility] Care Management  [phone]

## 2020-01-11 DIAGNOSIS — T84498D Other mechanical complication of other internal orthopedic devices, implants and grafts, subsequent encounter: Secondary | ICD-10-CM | POA: Diagnosis not present

## 2020-01-11 DIAGNOSIS — M87251 Osteonecrosis due to previous trauma, right femur: Secondary | ICD-10-CM | POA: Diagnosis not present

## 2020-01-11 DIAGNOSIS — M810 Age-related osteoporosis without current pathological fracture: Secondary | ICD-10-CM | POA: Diagnosis not present

## 2020-01-11 DIAGNOSIS — D510 Vitamin B12 deficiency anemia due to intrinsic factor deficiency: Secondary | ICD-10-CM | POA: Diagnosis not present

## 2020-01-11 DIAGNOSIS — E559 Vitamin D deficiency, unspecified: Secondary | ICD-10-CM | POA: Diagnosis not present

## 2020-01-11 DIAGNOSIS — J479 Bronchiectasis, uncomplicated: Secondary | ICD-10-CM | POA: Diagnosis not present

## 2020-01-12 IMAGING — RF DG THORACIC SPINE 2V
1 series · 2 of 2 positions shown · non-contrast
Comparison: Radiographs September 01, 2018. MRI of August 25, 2018.

CLINICAL DATA: Status post kyphoplasty of T7, T8 and T9.

EXAM:
DG C-ARM 61-120 MIN; THORACIC SPINE 2 VIEWS
FLUOROSCOPY TIME:  3 minutes 7 seconds.

[Series 1: run · 2 of 2 slices shown]
[im 1/2]
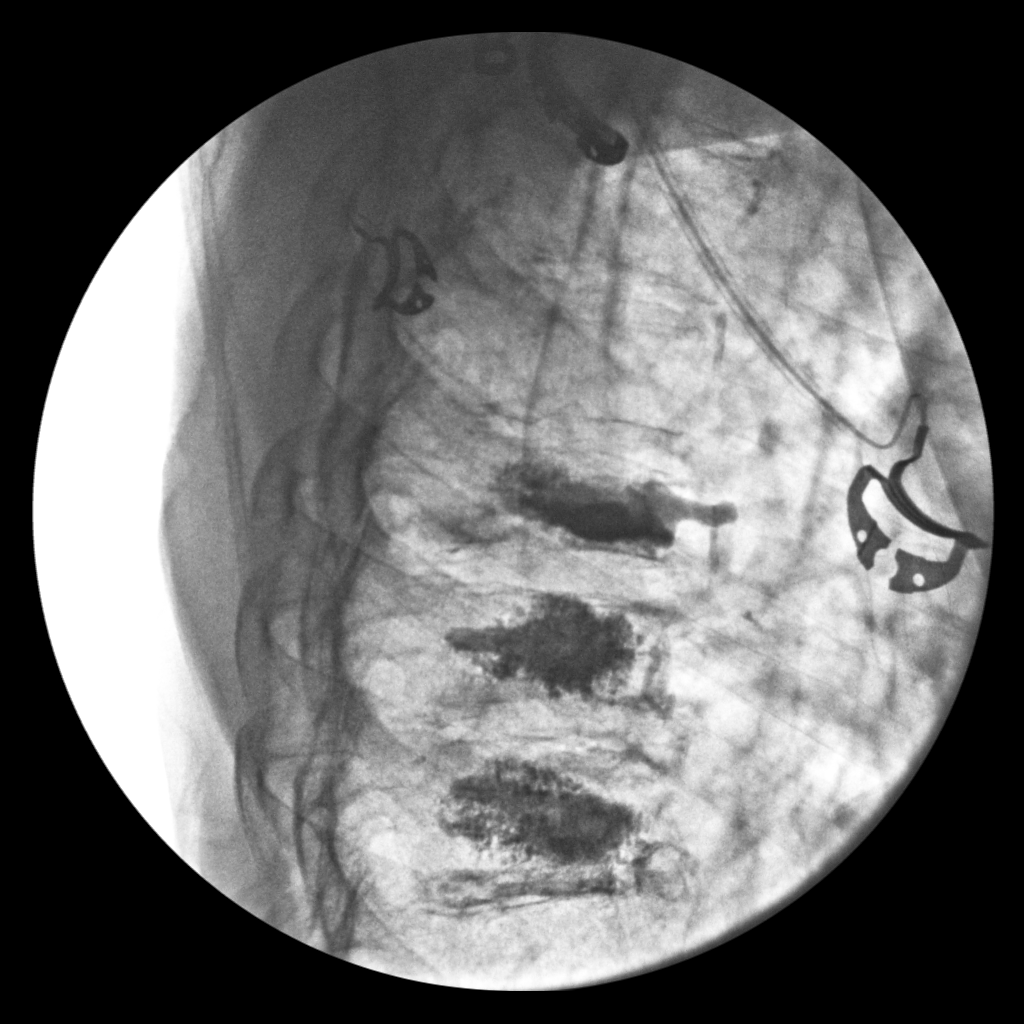
[im 2/2]
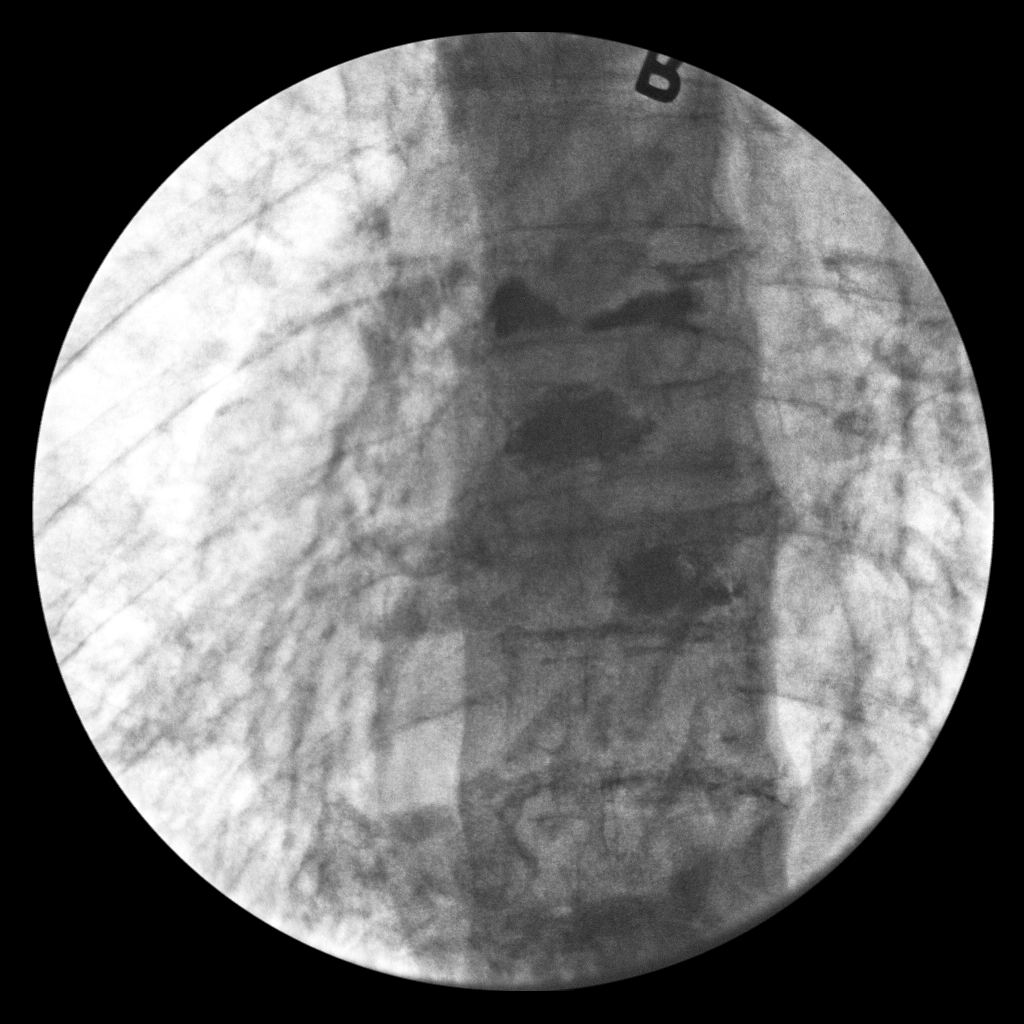

[2 of 2 positions shown; findings below may reference images not displayed]

FINDINGS: Two fluoroscopic images demonstrate the patient be status post
kyphoplasty of 3 adjacent lower thoracic vertebral bodies,
presumably T7, T8 and T9 based on history.
IMPRESSION: Fluoroscopic guidance provided during kyphoplasty of multiple lower
thoracic vertebral bodies.

## 2020-01-21 DIAGNOSIS — T84498D Other mechanical complication of other internal orthopedic devices, implants and grafts, subsequent encounter: Secondary | ICD-10-CM | POA: Diagnosis not present

## 2020-01-21 DIAGNOSIS — E559 Vitamin D deficiency, unspecified: Secondary | ICD-10-CM | POA: Diagnosis not present

## 2020-01-21 DIAGNOSIS — J479 Bronchiectasis, uncomplicated: Secondary | ICD-10-CM | POA: Diagnosis not present

## 2020-01-21 DIAGNOSIS — D510 Vitamin B12 deficiency anemia due to intrinsic factor deficiency: Secondary | ICD-10-CM | POA: Diagnosis not present

## 2020-01-21 DIAGNOSIS — M810 Age-related osteoporosis without current pathological fracture: Secondary | ICD-10-CM | POA: Diagnosis not present

## 2020-01-21 DIAGNOSIS — M87251 Osteonecrosis due to previous trauma, right femur: Secondary | ICD-10-CM | POA: Diagnosis not present

## 2020-01-22 DIAGNOSIS — D510 Vitamin B12 deficiency anemia due to intrinsic factor deficiency: Secondary | ICD-10-CM | POA: Diagnosis not present

## 2020-01-22 DIAGNOSIS — T84498D Other mechanical complication of other internal orthopedic devices, implants and grafts, subsequent encounter: Secondary | ICD-10-CM | POA: Diagnosis not present

## 2020-01-22 DIAGNOSIS — M810 Age-related osteoporosis without current pathological fracture: Secondary | ICD-10-CM | POA: Diagnosis not present

## 2020-01-22 DIAGNOSIS — J479 Bronchiectasis, uncomplicated: Secondary | ICD-10-CM | POA: Diagnosis not present

## 2020-01-22 DIAGNOSIS — M87251 Osteonecrosis due to previous trauma, right femur: Secondary | ICD-10-CM | POA: Diagnosis not present

## 2020-01-22 DIAGNOSIS — E559 Vitamin D deficiency, unspecified: Secondary | ICD-10-CM | POA: Diagnosis not present

## 2020-01-25 DIAGNOSIS — T84498D Other mechanical complication of other internal orthopedic devices, implants and grafts, subsequent encounter: Secondary | ICD-10-CM | POA: Diagnosis not present

## 2020-01-25 DIAGNOSIS — D510 Vitamin B12 deficiency anemia due to intrinsic factor deficiency: Secondary | ICD-10-CM | POA: Diagnosis not present

## 2020-01-25 DIAGNOSIS — M810 Age-related osteoporosis without current pathological fracture: Secondary | ICD-10-CM | POA: Diagnosis not present

## 2020-01-25 DIAGNOSIS — E559 Vitamin D deficiency, unspecified: Secondary | ICD-10-CM | POA: Diagnosis not present

## 2020-01-25 DIAGNOSIS — M87251 Osteonecrosis due to previous trauma, right femur: Secondary | ICD-10-CM | POA: Diagnosis not present

## 2020-01-25 DIAGNOSIS — J479 Bronchiectasis, uncomplicated: Secondary | ICD-10-CM | POA: Diagnosis not present

## 2020-01-28 DIAGNOSIS — M87251 Osteonecrosis due to previous trauma, right femur: Secondary | ICD-10-CM | POA: Diagnosis not present

## 2020-01-28 DIAGNOSIS — D510 Vitamin B12 deficiency anemia due to intrinsic factor deficiency: Secondary | ICD-10-CM | POA: Diagnosis not present

## 2020-01-28 DIAGNOSIS — J479 Bronchiectasis, uncomplicated: Secondary | ICD-10-CM | POA: Diagnosis not present

## 2020-01-28 DIAGNOSIS — M810 Age-related osteoporosis without current pathological fracture: Secondary | ICD-10-CM | POA: Diagnosis not present

## 2020-01-28 DIAGNOSIS — E559 Vitamin D deficiency, unspecified: Secondary | ICD-10-CM | POA: Diagnosis not present

## 2020-01-28 DIAGNOSIS — T84498D Other mechanical complication of other internal orthopedic devices, implants and grafts, subsequent encounter: Secondary | ICD-10-CM | POA: Diagnosis not present

## 2020-02-03 ENCOUNTER — Other Ambulatory Visit: Payer: Self-pay | Admitting: *Deleted

## 2020-02-03 ENCOUNTER — Encounter: Payer: Self-pay | Admitting: *Deleted

## 2020-02-03 NOTE — Patient Outreach (Signed)
[place] [facility]) Care Management THN CM Telephone Outreach Post- SNF discharge day # 68 without hospital re-admission Unsuccessful (consecutive) outreach attempt # 1- previously engaged patient  02/03/2020  Jasmin Anderson 09/02/1935 [id number]  1:30/ 1:35 pm:  Unsuccessful telephone outreach attempt x 2 to Jasmin Anderson, 84 y/o female referred to [facility] RN CM 12/07/19 by [facility] RN PAC after patient experiencedmechanical fall with hip fracture with ORIF on August 24, 2019; patient was discharged to SNF for rehabilitation and subsequently experienced second hospitaladmissionfor revision of originalORIF. She was subsequently discharged from [facility] on April 30, 2021to home/ self-care with home health services through [facility]. Patient has history including, but not limited to, arthritis; GERD; anxiety.  HIPAA compliant voice mail message left for patient on home number, requesting return call back.  With call attempt to patient's mobile number, received automated outgoing voice message stating that patient's voice mail box is full and unable to accept messags; unable to leave patient HIPAA compliant voice message requesting call back on her mobile number  Plan:  Will re-attempt THN CM telephone outreach within 4 business days if I do not hear back from patient first  [name], RN, BSN, [employer] Coordinator [facility] Care Management  [phone]

## 2020-02-09 ENCOUNTER — Encounter: Payer: Self-pay | Admitting: *Deleted

## 2020-02-09 ENCOUNTER — Other Ambulatory Visit: Payer: Self-pay | Admitting: *Deleted

## 2020-02-09 NOTE — Patient Outreach (Signed)
[place] [facility]) Care Management [facility] CM Telephone Outreach Post- SNF discharge day # 14 without hospital re-admission  02/09/2020  Jasmin Anderson 10-Jan-1936 [id number]  Successful telephone outreach to Jasmin Anderson, 84 y/o female referred to [facility] RN CM 12/07/19 by [facility] RN PAC after patient experiencedmechanical fall with hip fracture with ORIF on August 24, 2019; patient was discharged to SNF for rehabilitation and subsequently experienced second hospitaladmissionfor revision of originalORIF. She was subsequently discharged from [facility] on April 30, 2021to home/ self-care with home health services through [facility]. Patient has history including, but not limited to, arthritis; GERD; anxiety.  HIPAA/ identity verifiedand patient reports "doing great;" she denies pain/ clinical concerns and confirms that she continues independent in ADL's and iADL's; driving self short distances, and engaging with her family/ caregivers for support as/ if needed/ indicated.  Able to continue taking care of her many dogs that live outside.  Confirms that home health services have been completed and feels that she has no unmet care needs.  She confirms that there has been no new or recent changes to medications and that she continues using walker/ cane for ambulation and carrying her cell phone with her at all times.  Previously provided education around fall prevention was reviewed with patient today.  We also reviewed reasons to contact care providers vs. seek emergent/ urgent care and I confirmed that patient has had no recent provider appointments; she confirms she has contact information for al care providers and understands to call promptly for any new concerns/ issues/ problems that arise.  Patient denies further issues, concerns, or problems today. Iconfirmed that patient hasmy direct phone number, the main THN CM office phone number, and the [facility] CM 24-hour nurse advice phone number should  issues arise prior to next scheduled THN CM outreachby phone in 8 weeks. Encouraged patient to contact me directly if needs, questions, issues, or concerns arise prior to next scheduled outreach; patient agreed to do so.  Plan:  Patient will take medications as prescribed and will attend all scheduled provider appointments  Patient will promptly notify care providers for any new concerns/ issues/ problems that arise  Patient will continue to use assistive devices for fall prevention  THN CM outreachto continue with scheduled phone call in 2 months, unless indicated sooner, possibly for case closure  [name], RN, BSN, [place] Care Management  [phone]

## 2020-02-17 DIAGNOSIS — D510 Vitamin B12 deficiency anemia due to intrinsic factor deficiency: Secondary | ICD-10-CM | POA: Diagnosis not present

## 2020-03-21 DIAGNOSIS — D510 Vitamin B12 deficiency anemia due to intrinsic factor deficiency: Secondary | ICD-10-CM | POA: Diagnosis not present

## 2020-03-30 DIAGNOSIS — Z23 Encounter for immunization: Secondary | ICD-10-CM | POA: Diagnosis not present

## 2020-03-31 ENCOUNTER — Encounter: Payer: Self-pay | Admitting: *Deleted

## 2020-03-31 ENCOUNTER — Other Ambulatory Visit: Payer: Self-pay | Admitting: *Deleted

## 2020-03-31 NOTE — Patient Outreach (Signed)
[facility]) Care Management [facility] CM Telephone Outreach, Case Closure- successfully met goal  03/31/2020  [name] 1936/04/22 [id number]  Successful telephone outreach to Jasmin Anderson, 84 y/o female referred to [facility] 12/07/19 by [facility] RN PAC after patient experiencedmechanical fall with hip fracture with ORIF on August 24, 2019; patient was discharged to SNF for rehabilitation and subsequently experienced second hospitaladmissionfor revision of originalORIF. She was subsequently discharged from [facility] on April 30, 2021to home/ self-care with home health services through [facility]. Patient has history including, but not limited to, arthritis; GERD; anxiety.  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  Family remains supportive/ involved. Patient continues to deny ongoing care coordination/ management/ disease management/ pharmacy/ community resource needs.  -- reviewed with patient her current clinical condition and confirmed no concerns/ issues/ problems -- discussed patient's recent decision to obtain corona virus vaccine: positive reinforcement provided -- reviewed with patient previously provided fall risk/ prevention education -- confirmed no recent falls; continues using assistive devices per baseline; continues taking phone with her at all times -- no recent provider appointments -- confirms she has contact information for al care providers and understands to call promptly for any new concerns/ issues/ problems that arise.  Patient denies further issues, concerns, or problems today. Per our previous conversation, discussed with patient that she has thus far met all of her previously established [facility] CM goals, and case closure, which patient agrees with today.  She confirms  that she hasmy direct phone number, the main [facility] CM office phone number, and the [facility] CM 24-hour nurse advice phone number should issues arise  In the future.  Plan:  Will close [facility] CM program, as patient has successfully met her previously established goals and denies ongoing care coordination/ disease management/ pharmacy/ community resource needs, and will make patient's PCP aware of same.  It has been a pleasure caring for [name], RN, BSN, [employer] Coordinator [facility] Care Management  [phone]

## 2020-04-14 DIAGNOSIS — Z8781 Personal history of (healed) traumatic fracture: Secondary | ICD-10-CM | POA: Diagnosis not present

## 2020-04-14 DIAGNOSIS — Z681 Body mass index (BMI) 19 or less, adult: Secondary | ICD-10-CM | POA: Diagnosis not present

## 2020-04-14 DIAGNOSIS — J479 Bronchiectasis, uncomplicated: Secondary | ICD-10-CM | POA: Diagnosis not present

## 2020-04-14 DIAGNOSIS — M1990 Unspecified osteoarthritis, unspecified site: Secondary | ICD-10-CM | POA: Diagnosis not present

## 2020-04-14 DIAGNOSIS — E559 Vitamin D deficiency, unspecified: Secondary | ICD-10-CM | POA: Diagnosis not present

## 2020-04-14 DIAGNOSIS — R634 Abnormal weight loss: Secondary | ICD-10-CM | POA: Diagnosis not present

## 2020-04-14 DIAGNOSIS — S72009A Fracture of unspecified part of neck of unspecified femur, initial encounter for closed fracture: Secondary | ICD-10-CM | POA: Diagnosis not present

## 2020-04-14 DIAGNOSIS — M810 Age-related osteoporosis without current pathological fracture: Secondary | ICD-10-CM | POA: Diagnosis not present

## 2020-04-14 DIAGNOSIS — F419 Anxiety disorder, unspecified: Secondary | ICD-10-CM | POA: Diagnosis not present

## 2020-04-14 DIAGNOSIS — D510 Vitamin B12 deficiency anemia due to intrinsic factor deficiency: Secondary | ICD-10-CM | POA: Diagnosis not present

## 2020-04-14 DIAGNOSIS — S72001D Fracture of unspecified part of neck of right femur, subsequent encounter for closed fracture with routine healing: Secondary | ICD-10-CM | POA: Diagnosis not present

## 2020-04-21 DIAGNOSIS — D510 Vitamin B12 deficiency anemia due to intrinsic factor deficiency: Secondary | ICD-10-CM | POA: Diagnosis not present

## 2020-04-27 DIAGNOSIS — Z23 Encounter for immunization: Secondary | ICD-10-CM | POA: Diagnosis not present

## 2020-05-23 DIAGNOSIS — D510 Vitamin B12 deficiency anemia due to intrinsic factor deficiency: Secondary | ICD-10-CM | POA: Diagnosis not present

## 2020-06-21 DIAGNOSIS — M810 Age-related osteoporosis without current pathological fracture: Secondary | ICD-10-CM | POA: Diagnosis not present

## 2020-07-14 DIAGNOSIS — J479 Bronchiectasis, uncomplicated: Secondary | ICD-10-CM | POA: Diagnosis not present

## 2020-07-14 DIAGNOSIS — K219 Gastro-esophageal reflux disease without esophagitis: Secondary | ICD-10-CM | POA: Diagnosis not present

## 2020-07-14 DIAGNOSIS — Z681 Body mass index (BMI) 19 or less, adult: Secondary | ICD-10-CM | POA: Diagnosis not present

## 2020-07-14 DIAGNOSIS — E559 Vitamin D deficiency, unspecified: Secondary | ICD-10-CM | POA: Diagnosis not present

## 2020-07-14 DIAGNOSIS — R634 Abnormal weight loss: Secondary | ICD-10-CM | POA: Diagnosis not present

## 2020-07-14 DIAGNOSIS — D510 Vitamin B12 deficiency anemia due to intrinsic factor deficiency: Secondary | ICD-10-CM | POA: Diagnosis not present

## 2020-07-14 DIAGNOSIS — M810 Age-related osteoporosis without current pathological fracture: Secondary | ICD-10-CM | POA: Diagnosis not present

## 2020-07-14 DIAGNOSIS — D539 Nutritional anemia, unspecified: Secondary | ICD-10-CM | POA: Diagnosis not present

## 2020-07-14 DIAGNOSIS — M1990 Unspecified osteoarthritis, unspecified site: Secondary | ICD-10-CM | POA: Diagnosis not present

## 2020-07-14 DIAGNOSIS — Z2821 Immunization not carried out because of patient refusal: Secondary | ICD-10-CM | POA: Diagnosis not present

## 2020-07-14 DIAGNOSIS — F419 Anxiety disorder, unspecified: Secondary | ICD-10-CM | POA: Diagnosis not present

## 2020-07-20 DIAGNOSIS — Z9181 History of falling: Secondary | ICD-10-CM | POA: Diagnosis not present

## 2020-07-20 DIAGNOSIS — Z1331 Encounter for screening for depression: Secondary | ICD-10-CM | POA: Diagnosis not present

## 2020-07-20 DIAGNOSIS — E785 Hyperlipidemia, unspecified: Secondary | ICD-10-CM | POA: Diagnosis not present

## 2020-07-20 DIAGNOSIS — Z0000 Encounter for general adult medical examination without abnormal findings: Secondary | ICD-10-CM | POA: Diagnosis not present

## 2020-08-29 DIAGNOSIS — D510 Vitamin B12 deficiency anemia due to intrinsic factor deficiency: Secondary | ICD-10-CM | POA: Diagnosis not present

## 2020-08-29 DIAGNOSIS — D539 Nutritional anemia, unspecified: Secondary | ICD-10-CM | POA: Diagnosis not present

## 2020-09-27 DIAGNOSIS — D510 Vitamin B12 deficiency anemia due to intrinsic factor deficiency: Secondary | ICD-10-CM | POA: Diagnosis not present

## 2020-10-13 DIAGNOSIS — E538 Deficiency of other specified B group vitamins: Secondary | ICD-10-CM | POA: Diagnosis not present

## 2020-10-13 DIAGNOSIS — D509 Iron deficiency anemia, unspecified: Secondary | ICD-10-CM | POA: Diagnosis not present

## 2020-10-14 ENCOUNTER — Other Ambulatory Visit: Payer: Self-pay | Admitting: Internal Medicine

## 2020-10-14 DIAGNOSIS — R109 Unspecified abdominal pain: Secondary | ICD-10-CM

## 2020-10-14 DIAGNOSIS — R5383 Other fatigue: Secondary | ICD-10-CM

## 2020-11-01 ENCOUNTER — Ambulatory Visit
Admission: RE | Admit: 2020-11-01 | Discharge: 2020-11-01 | Disposition: A | Discharge status: Home / Self Care | Payer: Medicare Other | Source: Ambulatory Visit | Attending: Internal Medicine | Admitting: Internal Medicine

## 2020-11-01 DIAGNOSIS — K5730 Diverticulosis of large intestine without perforation or abscess without bleeding: Secondary | ICD-10-CM | POA: Diagnosis not present

## 2020-11-01 DIAGNOSIS — R109 Unspecified abdominal pain: Secondary | ICD-10-CM

## 2020-11-01 DIAGNOSIS — R5383 Other fatigue: Secondary | ICD-10-CM

## 2020-11-01 MED ORDER — IOPAMIDOL (ISOVUE-300) INJECTION 61%
100.0000 mL | Freq: Once | INTRAVENOUS | Status: AC | PRN
  Administered 2020-11-01: 100 mL via INTRAVENOUS

## 2020-11-03 DIAGNOSIS — S80811A Abrasion, right lower leg, initial encounter: Secondary | ICD-10-CM | POA: Diagnosis not present

## 2020-11-03 DIAGNOSIS — R600 Localized edema: Secondary | ICD-10-CM | POA: Diagnosis not present

## 2020-11-03 DIAGNOSIS — L03115 Cellulitis of right lower limb: Secondary | ICD-10-CM | POA: Diagnosis not present

## 2020-11-08 DIAGNOSIS — K219 Gastro-esophageal reflux disease without esophagitis: Secondary | ICD-10-CM | POA: Diagnosis not present

## 2020-11-08 DIAGNOSIS — D539 Nutritional anemia, unspecified: Secondary | ICD-10-CM | POA: Diagnosis not present

## 2020-11-08 DIAGNOSIS — R0600 Dyspnea, unspecified: Secondary | ICD-10-CM | POA: Diagnosis not present

## 2020-11-08 DIAGNOSIS — R636 Underweight: Secondary | ICD-10-CM | POA: Diagnosis not present

## 2020-11-08 DIAGNOSIS — M810 Age-related osteoporosis without current pathological fracture: Secondary | ICD-10-CM | POA: Diagnosis not present

## 2020-11-08 DIAGNOSIS — E441 Mild protein-calorie malnutrition: Secondary | ICD-10-CM | POA: Diagnosis not present

## 2020-11-08 DIAGNOSIS — R011 Cardiac murmur, unspecified: Secondary | ICD-10-CM | POA: Diagnosis not present

## 2020-11-08 DIAGNOSIS — D510 Vitamin B12 deficiency anemia due to intrinsic factor deficiency: Secondary | ICD-10-CM | POA: Diagnosis not present

## 2020-11-08 DIAGNOSIS — Z681 Body mass index (BMI) 19 or less, adult: Secondary | ICD-10-CM | POA: Diagnosis not present

## 2020-11-08 DIAGNOSIS — R600 Localized edema: Secondary | ICD-10-CM | POA: Diagnosis not present

## 2020-11-08 DIAGNOSIS — L03115 Cellulitis of right lower limb: Secondary | ICD-10-CM | POA: Diagnosis not present

## 2020-11-12 DIAGNOSIS — R600 Localized edema: Secondary | ICD-10-CM | POA: Diagnosis not present

## 2020-11-12 DIAGNOSIS — D509 Iron deficiency anemia, unspecified: Secondary | ICD-10-CM | POA: Diagnosis not present

## 2020-11-12 DIAGNOSIS — Z681 Body mass index (BMI) 19 or less, adult: Secondary | ICD-10-CM | POA: Diagnosis not present

## 2020-11-12 DIAGNOSIS — Z79899 Other long term (current) drug therapy: Secondary | ICD-10-CM | POA: Diagnosis not present

## 2020-11-12 DIAGNOSIS — D649 Anemia, unspecified: Secondary | ICD-10-CM | POA: Diagnosis not present

## 2020-11-12 DIAGNOSIS — J449 Chronic obstructive pulmonary disease, unspecified: Secondary | ICD-10-CM | POA: Diagnosis not present

## 2020-11-12 DIAGNOSIS — Z7982 Long term (current) use of aspirin: Secondary | ICD-10-CM | POA: Diagnosis not present

## 2020-11-12 DIAGNOSIS — L03115 Cellulitis of right lower limb: Secondary | ICD-10-CM | POA: Diagnosis not present

## 2020-11-12 DIAGNOSIS — E43 Unspecified severe protein-calorie malnutrition: Secondary | ICD-10-CM | POA: Diagnosis not present

## 2020-11-12 DIAGNOSIS — I824Z2 Acute embolism and thrombosis of unspecified deep veins of left distal lower extremity: Secondary | ICD-10-CM | POA: Diagnosis not present

## 2020-11-12 DIAGNOSIS — K219 Gastro-esophageal reflux disease without esophagitis: Secondary | ICD-10-CM | POA: Diagnosis not present

## 2020-11-12 DIAGNOSIS — F419 Anxiety disorder, unspecified: Secondary | ICD-10-CM | POA: Diagnosis not present

## 2020-11-12 DIAGNOSIS — R918 Other nonspecific abnormal finding of lung field: Secondary | ICD-10-CM | POA: Diagnosis not present

## 2020-11-12 DIAGNOSIS — Z79891 Long term (current) use of opiate analgesic: Secondary | ICD-10-CM | POA: Diagnosis not present

## 2020-11-12 DIAGNOSIS — M1990 Unspecified osteoarthritis, unspecified site: Secondary | ICD-10-CM | POA: Diagnosis not present

## 2020-11-13 DIAGNOSIS — J449 Chronic obstructive pulmonary disease, unspecified: Secondary | ICD-10-CM | POA: Diagnosis present

## 2020-11-13 DIAGNOSIS — K219 Gastro-esophageal reflux disease without esophagitis: Secondary | ICD-10-CM | POA: Diagnosis present

## 2020-11-13 DIAGNOSIS — Z681 Body mass index (BMI) 19 or less, adult: Secondary | ICD-10-CM | POA: Diagnosis not present

## 2020-11-13 DIAGNOSIS — M1990 Unspecified osteoarthritis, unspecified site: Secondary | ICD-10-CM | POA: Diagnosis present

## 2020-11-13 DIAGNOSIS — M7989 Other specified soft tissue disorders: Secondary | ICD-10-CM | POA: Diagnosis not present

## 2020-11-13 DIAGNOSIS — Z7982 Long term (current) use of aspirin: Secondary | ICD-10-CM | POA: Diagnosis not present

## 2020-11-13 DIAGNOSIS — Z79899 Other long term (current) drug therapy: Secondary | ICD-10-CM | POA: Diagnosis not present

## 2020-11-13 DIAGNOSIS — D509 Iron deficiency anemia, unspecified: Secondary | ICD-10-CM | POA: Diagnosis present

## 2020-11-13 DIAGNOSIS — I824Z2 Acute embolism and thrombosis of unspecified deep veins of left distal lower extremity: Secondary | ICD-10-CM | POA: Diagnosis present

## 2020-11-13 DIAGNOSIS — E43 Unspecified severe protein-calorie malnutrition: Secondary | ICD-10-CM | POA: Diagnosis present

## 2020-11-13 DIAGNOSIS — Z79891 Long term (current) use of opiate analgesic: Secondary | ICD-10-CM | POA: Diagnosis not present

## 2020-11-13 DIAGNOSIS — L03115 Cellulitis of right lower limb: Secondary | ICD-10-CM | POA: Diagnosis present

## 2020-11-13 DIAGNOSIS — F419 Anxiety disorder, unspecified: Secondary | ICD-10-CM | POA: Diagnosis present

## 2020-11-13 DIAGNOSIS — I82432 Acute embolism and thrombosis of left popliteal vein: Secondary | ICD-10-CM | POA: Diagnosis not present

## 2020-11-16 DIAGNOSIS — I82422 Acute embolism and thrombosis of left iliac vein: Secondary | ICD-10-CM | POA: Diagnosis not present

## 2020-11-16 DIAGNOSIS — M1990 Unspecified osteoarthritis, unspecified site: Secondary | ICD-10-CM | POA: Diagnosis not present

## 2020-11-16 DIAGNOSIS — G8929 Other chronic pain: Secondary | ICD-10-CM | POA: Diagnosis not present

## 2020-11-16 DIAGNOSIS — D509 Iron deficiency anemia, unspecified: Secondary | ICD-10-CM | POA: Diagnosis not present

## 2020-11-16 DIAGNOSIS — Z7901 Long term (current) use of anticoagulants: Secondary | ICD-10-CM | POA: Diagnosis not present

## 2020-11-16 DIAGNOSIS — Z79899 Other long term (current) drug therapy: Secondary | ICD-10-CM | POA: Diagnosis not present

## 2020-11-16 DIAGNOSIS — E785 Hyperlipidemia, unspecified: Secondary | ICD-10-CM | POA: Diagnosis not present

## 2020-11-16 DIAGNOSIS — L03115 Cellulitis of right lower limb: Secondary | ICD-10-CM | POA: Diagnosis not present

## 2020-11-16 DIAGNOSIS — Z7982 Long term (current) use of aspirin: Secondary | ICD-10-CM | POA: Diagnosis not present

## 2020-11-16 DIAGNOSIS — Z681 Body mass index (BMI) 19 or less, adult: Secondary | ICD-10-CM | POA: Diagnosis not present

## 2020-11-16 DIAGNOSIS — K5900 Constipation, unspecified: Secondary | ICD-10-CM | POA: Diagnosis not present

## 2020-11-16 DIAGNOSIS — J449 Chronic obstructive pulmonary disease, unspecified: Secondary | ICD-10-CM | POA: Diagnosis not present

## 2020-11-16 DIAGNOSIS — Z79891 Long term (current) use of opiate analgesic: Secondary | ICD-10-CM | POA: Diagnosis not present

## 2020-11-16 DIAGNOSIS — F419 Anxiety disorder, unspecified: Secondary | ICD-10-CM | POA: Diagnosis not present

## 2020-11-16 DIAGNOSIS — R609 Edema, unspecified: Secondary | ICD-10-CM | POA: Diagnosis not present

## 2020-11-16 DIAGNOSIS — K219 Gastro-esophageal reflux disease without esophagitis: Secondary | ICD-10-CM | POA: Diagnosis not present

## 2020-11-16 DIAGNOSIS — R2681 Unsteadiness on feet: Secondary | ICD-10-CM | POA: Diagnosis not present

## 2020-11-16 DIAGNOSIS — E43 Unspecified severe protein-calorie malnutrition: Secondary | ICD-10-CM | POA: Diagnosis not present

## 2020-11-17 DIAGNOSIS — E43 Unspecified severe protein-calorie malnutrition: Secondary | ICD-10-CM | POA: Diagnosis not present

## 2020-11-17 DIAGNOSIS — F419 Anxiety disorder, unspecified: Secondary | ICD-10-CM | POA: Diagnosis not present

## 2020-11-17 DIAGNOSIS — I82422 Acute embolism and thrombosis of left iliac vein: Secondary | ICD-10-CM | POA: Diagnosis not present

## 2020-11-17 DIAGNOSIS — D509 Iron deficiency anemia, unspecified: Secondary | ICD-10-CM | POA: Diagnosis not present

## 2020-11-17 DIAGNOSIS — L03115 Cellulitis of right lower limb: Secondary | ICD-10-CM | POA: Diagnosis not present

## 2020-11-17 DIAGNOSIS — J449 Chronic obstructive pulmonary disease, unspecified: Secondary | ICD-10-CM | POA: Diagnosis not present

## 2020-11-21 DIAGNOSIS — Z79899 Other long term (current) drug therapy: Secondary | ICD-10-CM | POA: Diagnosis not present

## 2020-11-21 DIAGNOSIS — D539 Nutritional anemia, unspecified: Secondary | ICD-10-CM | POA: Diagnosis not present

## 2020-11-21 DIAGNOSIS — L03115 Cellulitis of right lower limb: Secondary | ICD-10-CM | POA: Diagnosis not present

## 2020-11-21 DIAGNOSIS — I82432 Acute embolism and thrombosis of left popliteal vein: Secondary | ICD-10-CM | POA: Diagnosis not present

## 2020-11-21 DIAGNOSIS — E441 Mild protein-calorie malnutrition: Secondary | ICD-10-CM | POA: Diagnosis not present

## 2020-11-21 DIAGNOSIS — J479 Bronchiectasis, uncomplicated: Secondary | ICD-10-CM | POA: Diagnosis not present

## 2020-11-21 DIAGNOSIS — D510 Vitamin B12 deficiency anemia due to intrinsic factor deficiency: Secondary | ICD-10-CM | POA: Diagnosis not present

## 2020-11-21 DIAGNOSIS — Z681 Body mass index (BMI) 19 or less, adult: Secondary | ICD-10-CM | POA: Diagnosis not present

## 2020-11-21 DIAGNOSIS — R600 Localized edema: Secondary | ICD-10-CM | POA: Diagnosis not present

## 2020-11-22 DIAGNOSIS — L03115 Cellulitis of right lower limb: Secondary | ICD-10-CM | POA: Diagnosis not present

## 2020-11-22 DIAGNOSIS — J449 Chronic obstructive pulmonary disease, unspecified: Secondary | ICD-10-CM | POA: Diagnosis not present

## 2020-11-22 DIAGNOSIS — E43 Unspecified severe protein-calorie malnutrition: Secondary | ICD-10-CM | POA: Diagnosis not present

## 2020-11-22 DIAGNOSIS — I82422 Acute embolism and thrombosis of left iliac vein: Secondary | ICD-10-CM | POA: Diagnosis not present

## 2020-11-22 DIAGNOSIS — D509 Iron deficiency anemia, unspecified: Secondary | ICD-10-CM | POA: Diagnosis not present

## 2020-11-22 DIAGNOSIS — F419 Anxiety disorder, unspecified: Secondary | ICD-10-CM | POA: Diagnosis not present

## 2020-11-24 DIAGNOSIS — I82422 Acute embolism and thrombosis of left iliac vein: Secondary | ICD-10-CM | POA: Diagnosis not present

## 2020-11-24 DIAGNOSIS — D509 Iron deficiency anemia, unspecified: Secondary | ICD-10-CM | POA: Diagnosis not present

## 2020-11-24 DIAGNOSIS — E43 Unspecified severe protein-calorie malnutrition: Secondary | ICD-10-CM | POA: Diagnosis not present

## 2020-11-24 DIAGNOSIS — J449 Chronic obstructive pulmonary disease, unspecified: Secondary | ICD-10-CM | POA: Diagnosis not present

## 2020-11-24 DIAGNOSIS — L03115 Cellulitis of right lower limb: Secondary | ICD-10-CM | POA: Diagnosis not present

## 2020-11-24 DIAGNOSIS — F419 Anxiety disorder, unspecified: Secondary | ICD-10-CM | POA: Diagnosis not present

## 2020-11-28 DIAGNOSIS — F419 Anxiety disorder, unspecified: Secondary | ICD-10-CM | POA: Diagnosis not present

## 2020-11-28 DIAGNOSIS — D509 Iron deficiency anemia, unspecified: Secondary | ICD-10-CM | POA: Diagnosis not present

## 2020-11-28 DIAGNOSIS — E43 Unspecified severe protein-calorie malnutrition: Secondary | ICD-10-CM | POA: Diagnosis not present

## 2020-11-28 DIAGNOSIS — L03115 Cellulitis of right lower limb: Secondary | ICD-10-CM | POA: Diagnosis not present

## 2020-11-28 DIAGNOSIS — J449 Chronic obstructive pulmonary disease, unspecified: Secondary | ICD-10-CM | POA: Diagnosis not present

## 2020-11-28 DIAGNOSIS — I82422 Acute embolism and thrombosis of left iliac vein: Secondary | ICD-10-CM | POA: Diagnosis not present

## 2020-11-30 DIAGNOSIS — E43 Unspecified severe protein-calorie malnutrition: Secondary | ICD-10-CM | POA: Diagnosis not present

## 2020-11-30 DIAGNOSIS — J449 Chronic obstructive pulmonary disease, unspecified: Secondary | ICD-10-CM | POA: Diagnosis not present

## 2020-11-30 DIAGNOSIS — I82422 Acute embolism and thrombosis of left iliac vein: Secondary | ICD-10-CM | POA: Diagnosis not present

## 2020-11-30 DIAGNOSIS — F419 Anxiety disorder, unspecified: Secondary | ICD-10-CM | POA: Diagnosis not present

## 2020-11-30 DIAGNOSIS — L03115 Cellulitis of right lower limb: Secondary | ICD-10-CM | POA: Diagnosis not present

## 2020-11-30 DIAGNOSIS — D509 Iron deficiency anemia, unspecified: Secondary | ICD-10-CM | POA: Diagnosis not present

## 2020-12-02 DIAGNOSIS — E43 Unspecified severe protein-calorie malnutrition: Secondary | ICD-10-CM | POA: Diagnosis not present

## 2020-12-02 DIAGNOSIS — J449 Chronic obstructive pulmonary disease, unspecified: Secondary | ICD-10-CM | POA: Diagnosis not present

## 2020-12-02 DIAGNOSIS — D509 Iron deficiency anemia, unspecified: Secondary | ICD-10-CM | POA: Diagnosis not present

## 2020-12-02 DIAGNOSIS — L03115 Cellulitis of right lower limb: Secondary | ICD-10-CM | POA: Diagnosis not present

## 2020-12-02 DIAGNOSIS — F419 Anxiety disorder, unspecified: Secondary | ICD-10-CM | POA: Diagnosis not present

## 2020-12-02 DIAGNOSIS — I82422 Acute embolism and thrombosis of left iliac vein: Secondary | ICD-10-CM | POA: Diagnosis not present

## 2020-12-05 DIAGNOSIS — I82422 Acute embolism and thrombosis of left iliac vein: Secondary | ICD-10-CM | POA: Diagnosis not present

## 2020-12-05 DIAGNOSIS — F419 Anxiety disorder, unspecified: Secondary | ICD-10-CM | POA: Diagnosis not present

## 2020-12-05 DIAGNOSIS — D509 Iron deficiency anemia, unspecified: Secondary | ICD-10-CM | POA: Diagnosis not present

## 2020-12-05 DIAGNOSIS — L03115 Cellulitis of right lower limb: Secondary | ICD-10-CM | POA: Diagnosis not present

## 2020-12-05 DIAGNOSIS — J449 Chronic obstructive pulmonary disease, unspecified: Secondary | ICD-10-CM | POA: Diagnosis not present

## 2020-12-05 DIAGNOSIS — E43 Unspecified severe protein-calorie malnutrition: Secondary | ICD-10-CM | POA: Diagnosis not present

## 2020-12-07 DIAGNOSIS — D509 Iron deficiency anemia, unspecified: Secondary | ICD-10-CM | POA: Diagnosis not present

## 2020-12-07 DIAGNOSIS — E43 Unspecified severe protein-calorie malnutrition: Secondary | ICD-10-CM | POA: Diagnosis not present

## 2020-12-07 DIAGNOSIS — J449 Chronic obstructive pulmonary disease, unspecified: Secondary | ICD-10-CM | POA: Diagnosis not present

## 2020-12-07 DIAGNOSIS — L03115 Cellulitis of right lower limb: Secondary | ICD-10-CM | POA: Diagnosis not present

## 2020-12-07 DIAGNOSIS — I82422 Acute embolism and thrombosis of left iliac vein: Secondary | ICD-10-CM | POA: Diagnosis not present

## 2020-12-07 DIAGNOSIS — F419 Anxiety disorder, unspecified: Secondary | ICD-10-CM | POA: Diagnosis not present

## 2020-12-08 DIAGNOSIS — F419 Anxiety disorder, unspecified: Secondary | ICD-10-CM | POA: Diagnosis not present

## 2020-12-08 DIAGNOSIS — D509 Iron deficiency anemia, unspecified: Secondary | ICD-10-CM | POA: Diagnosis not present

## 2020-12-08 DIAGNOSIS — I82422 Acute embolism and thrombosis of left iliac vein: Secondary | ICD-10-CM | POA: Diagnosis not present

## 2020-12-08 DIAGNOSIS — L03115 Cellulitis of right lower limb: Secondary | ICD-10-CM | POA: Diagnosis not present

## 2020-12-08 DIAGNOSIS — J449 Chronic obstructive pulmonary disease, unspecified: Secondary | ICD-10-CM | POA: Diagnosis not present

## 2020-12-08 DIAGNOSIS — E43 Unspecified severe protein-calorie malnutrition: Secondary | ICD-10-CM | POA: Diagnosis not present

## 2020-12-12 DIAGNOSIS — F419 Anxiety disorder, unspecified: Secondary | ICD-10-CM | POA: Diagnosis not present

## 2020-12-12 DIAGNOSIS — J449 Chronic obstructive pulmonary disease, unspecified: Secondary | ICD-10-CM | POA: Diagnosis not present

## 2020-12-12 DIAGNOSIS — L03115 Cellulitis of right lower limb: Secondary | ICD-10-CM | POA: Diagnosis not present

## 2020-12-12 DIAGNOSIS — E43 Unspecified severe protein-calorie malnutrition: Secondary | ICD-10-CM | POA: Diagnosis not present

## 2020-12-12 DIAGNOSIS — D509 Iron deficiency anemia, unspecified: Secondary | ICD-10-CM | POA: Diagnosis not present

## 2020-12-12 DIAGNOSIS — I82422 Acute embolism and thrombosis of left iliac vein: Secondary | ICD-10-CM | POA: Diagnosis not present

## 2020-12-15 DIAGNOSIS — I82422 Acute embolism and thrombosis of left iliac vein: Secondary | ICD-10-CM | POA: Diagnosis not present

## 2020-12-15 DIAGNOSIS — D509 Iron deficiency anemia, unspecified: Secondary | ICD-10-CM | POA: Diagnosis not present

## 2020-12-15 DIAGNOSIS — E43 Unspecified severe protein-calorie malnutrition: Secondary | ICD-10-CM | POA: Diagnosis not present

## 2020-12-15 DIAGNOSIS — L03115 Cellulitis of right lower limb: Secondary | ICD-10-CM | POA: Diagnosis not present

## 2020-12-15 DIAGNOSIS — F419 Anxiety disorder, unspecified: Secondary | ICD-10-CM | POA: Diagnosis not present

## 2020-12-15 DIAGNOSIS — J449 Chronic obstructive pulmonary disease, unspecified: Secondary | ICD-10-CM | POA: Diagnosis not present

## 2020-12-22 DIAGNOSIS — D539 Nutritional anemia, unspecified: Secondary | ICD-10-CM | POA: Diagnosis not present

## 2020-12-22 DIAGNOSIS — L03115 Cellulitis of right lower limb: Secondary | ICD-10-CM | POA: Diagnosis not present

## 2020-12-22 DIAGNOSIS — D510 Vitamin B12 deficiency anemia due to intrinsic factor deficiency: Secondary | ICD-10-CM | POA: Diagnosis not present

## 2020-12-22 DIAGNOSIS — E441 Mild protein-calorie malnutrition: Secondary | ICD-10-CM | POA: Diagnosis not present

## 2021-01-05 DIAGNOSIS — M810 Age-related osteoporosis without current pathological fracture: Secondary | ICD-10-CM | POA: Diagnosis not present

## 2021-01-20 DIAGNOSIS — D539 Nutritional anemia, unspecified: Secondary | ICD-10-CM | POA: Diagnosis not present

## 2021-01-20 DIAGNOSIS — Z681 Body mass index (BMI) 19 or less, adult: Secondary | ICD-10-CM | POA: Diagnosis not present

## 2021-01-20 DIAGNOSIS — R600 Localized edema: Secondary | ICD-10-CM | POA: Diagnosis not present

## 2021-01-23 DIAGNOSIS — D510 Vitamin B12 deficiency anemia due to intrinsic factor deficiency: Secondary | ICD-10-CM | POA: Diagnosis not present

## 2021-01-23 DIAGNOSIS — F419 Anxiety disorder, unspecified: Secondary | ICD-10-CM | POA: Diagnosis not present

## 2021-01-23 DIAGNOSIS — R600 Localized edema: Secondary | ICD-10-CM | POA: Diagnosis not present

## 2021-02-28 DIAGNOSIS — Z681 Body mass index (BMI) 19 or less, adult: Secondary | ICD-10-CM | POA: Diagnosis not present

## 2021-02-28 DIAGNOSIS — R600 Localized edema: Secondary | ICD-10-CM | POA: Diagnosis not present

## 2021-02-28 DIAGNOSIS — D510 Vitamin B12 deficiency anemia due to intrinsic factor deficiency: Secondary | ICD-10-CM | POA: Diagnosis not present

## 2021-03-31 DIAGNOSIS — K219 Gastro-esophageal reflux disease without esophagitis: Secondary | ICD-10-CM | POA: Diagnosis not present

## 2021-03-31 DIAGNOSIS — Z7902 Long term (current) use of antithrombotics/antiplatelets: Secondary | ICD-10-CM | POA: Diagnosis not present

## 2021-03-31 DIAGNOSIS — R0902 Hypoxemia: Secondary | ICD-10-CM | POA: Diagnosis not present

## 2021-03-31 DIAGNOSIS — S72415A Nondisplaced unspecified condyle fracture of lower end of left femur, initial encounter for closed fracture: Secondary | ICD-10-CM | POA: Diagnosis not present

## 2021-03-31 DIAGNOSIS — R269 Unspecified abnormalities of gait and mobility: Secondary | ICD-10-CM | POA: Diagnosis not present

## 2021-03-31 DIAGNOSIS — Z79899 Other long term (current) drug therapy: Secondary | ICD-10-CM | POA: Diagnosis not present

## 2021-03-31 DIAGNOSIS — M19011 Primary osteoarthritis, right shoulder: Secondary | ICD-10-CM | POA: Diagnosis not present

## 2021-03-31 DIAGNOSIS — S72142D Displaced intertrochanteric fracture of left femur, subsequent encounter for closed fracture with routine healing: Secondary | ICD-10-CM | POA: Diagnosis not present

## 2021-03-31 DIAGNOSIS — S72142A Displaced intertrochanteric fracture of left femur, initial encounter for closed fracture: Secondary | ICD-10-CM | POA: Diagnosis not present

## 2021-03-31 DIAGNOSIS — S72145A Nondisplaced intertrochanteric fracture of left femur, initial encounter for closed fracture: Secondary | ICD-10-CM | POA: Diagnosis not present

## 2021-03-31 DIAGNOSIS — S0003XA Contusion of scalp, initial encounter: Secondary | ICD-10-CM | POA: Diagnosis not present

## 2021-03-31 DIAGNOSIS — F419 Anxiety disorder, unspecified: Secondary | ICD-10-CM | POA: Diagnosis not present

## 2021-03-31 DIAGNOSIS — I10 Essential (primary) hypertension: Secondary | ICD-10-CM | POA: Diagnosis not present

## 2021-03-31 DIAGNOSIS — S0101XA Laceration without foreign body of scalp, initial encounter: Secondary | ICD-10-CM | POA: Diagnosis not present

## 2021-03-31 DIAGNOSIS — D509 Iron deficiency anemia, unspecified: Secondary | ICD-10-CM | POA: Diagnosis not present

## 2021-03-31 DIAGNOSIS — M1990 Unspecified osteoarthritis, unspecified site: Secondary | ICD-10-CM | POA: Diagnosis not present

## 2021-03-31 DIAGNOSIS — Z043 Encounter for examination and observation following other accident: Secondary | ICD-10-CM | POA: Diagnosis not present

## 2021-03-31 DIAGNOSIS — M47812 Spondylosis without myelopathy or radiculopathy, cervical region: Secondary | ICD-10-CM | POA: Diagnosis not present

## 2021-03-31 DIAGNOSIS — W19XXXA Unspecified fall, initial encounter: Secondary | ICD-10-CM | POA: Diagnosis not present

## 2021-03-31 DIAGNOSIS — M25552 Pain in left hip: Secondary | ICD-10-CM | POA: Diagnosis not present

## 2021-03-31 DIAGNOSIS — J188 Other pneumonia, unspecified organism: Secondary | ICD-10-CM | POA: Diagnosis not present

## 2021-03-31 DIAGNOSIS — Z7401 Bed confinement status: Secondary | ICD-10-CM | POA: Diagnosis not present

## 2021-03-31 DIAGNOSIS — M6281 Muscle weakness (generalized): Secondary | ICD-10-CM | POA: Diagnosis not present

## 2021-03-31 DIAGNOSIS — R011 Cardiac murmur, unspecified: Secondary | ICD-10-CM | POA: Diagnosis not present

## 2021-03-31 DIAGNOSIS — E43 Unspecified severe protein-calorie malnutrition: Secondary | ICD-10-CM | POA: Diagnosis not present

## 2021-03-31 DIAGNOSIS — R519 Headache, unspecified: Secondary | ICD-10-CM | POA: Diagnosis not present

## 2021-03-31 DIAGNOSIS — R531 Weakness: Secondary | ICD-10-CM | POA: Diagnosis not present

## 2021-03-31 DIAGNOSIS — S72145D Nondisplaced intertrochanteric fracture of left femur, subsequent encounter for closed fracture with routine healing: Secondary | ICD-10-CM | POA: Diagnosis not present

## 2021-03-31 DIAGNOSIS — M87051 Idiopathic aseptic necrosis of right femur: Secondary | ICD-10-CM | POA: Diagnosis not present

## 2021-03-31 DIAGNOSIS — S0003XD Contusion of scalp, subsequent encounter: Secondary | ICD-10-CM | POA: Diagnosis not present

## 2021-03-31 DIAGNOSIS — D72829 Elevated white blood cell count, unspecified: Secondary | ICD-10-CM | POA: Diagnosis not present

## 2021-03-31 DIAGNOSIS — J449 Chronic obstructive pulmonary disease, unspecified: Secondary | ICD-10-CM | POA: Diagnosis not present

## 2021-03-31 DIAGNOSIS — M25519 Pain in unspecified shoulder: Secondary | ICD-10-CM | POA: Diagnosis not present

## 2021-03-31 DIAGNOSIS — S72144D Nondisplaced intertrochanteric fracture of right femur, subsequent encounter for closed fracture with routine healing: Secondary | ICD-10-CM | POA: Diagnosis not present

## 2021-03-31 DIAGNOSIS — S40011A Contusion of right shoulder, initial encounter: Secondary | ICD-10-CM | POA: Diagnosis not present

## 2021-03-31 DIAGNOSIS — W19XXXD Unspecified fall, subsequent encounter: Secondary | ICD-10-CM | POA: Diagnosis not present

## 2021-03-31 DIAGNOSIS — R2689 Other abnormalities of gait and mobility: Secondary | ICD-10-CM | POA: Diagnosis not present

## 2021-03-31 DIAGNOSIS — R2681 Unsteadiness on feet: Secondary | ICD-10-CM | POA: Diagnosis not present

## 2021-03-31 DIAGNOSIS — S0101XD Laceration without foreign body of scalp, subsequent encounter: Secondary | ICD-10-CM | POA: Diagnosis not present

## 2021-03-31 DIAGNOSIS — I959 Hypotension, unspecified: Secondary | ICD-10-CM | POA: Diagnosis not present

## 2021-03-31 DIAGNOSIS — F32A Depression, unspecified: Secondary | ICD-10-CM | POA: Diagnosis not present

## 2021-03-31 DIAGNOSIS — S72002A Fracture of unspecified part of neck of left femur, initial encounter for closed fracture: Secondary | ICD-10-CM | POA: Diagnosis not present

## 2021-03-31 DIAGNOSIS — Z86718 Personal history of other venous thrombosis and embolism: Secondary | ICD-10-CM | POA: Diagnosis not present

## 2021-04-05 DIAGNOSIS — R5381 Other malaise: Secondary | ICD-10-CM | POA: Diagnosis not present

## 2021-04-05 DIAGNOSIS — D72829 Elevated white blood cell count, unspecified: Secondary | ICD-10-CM | POA: Diagnosis not present

## 2021-04-05 DIAGNOSIS — F411 Generalized anxiety disorder: Secondary | ICD-10-CM | POA: Diagnosis not present

## 2021-04-05 DIAGNOSIS — S40011A Contusion of right shoulder, initial encounter: Secondary | ICD-10-CM | POA: Diagnosis not present

## 2021-04-05 DIAGNOSIS — R2681 Unsteadiness on feet: Secondary | ICD-10-CM | POA: Diagnosis not present

## 2021-04-05 DIAGNOSIS — I4891 Unspecified atrial fibrillation: Secondary | ICD-10-CM | POA: Diagnosis not present

## 2021-04-05 DIAGNOSIS — S72144D Nondisplaced intertrochanteric fracture of right femur, subsequent encounter for closed fracture with routine healing: Secondary | ICD-10-CM | POA: Diagnosis not present

## 2021-04-05 DIAGNOSIS — A498 Other bacterial infections of unspecified site: Secondary | ICD-10-CM | POA: Diagnosis not present

## 2021-04-05 DIAGNOSIS — W19XXXA Unspecified fall, initial encounter: Secondary | ICD-10-CM | POA: Diagnosis not present

## 2021-04-05 DIAGNOSIS — J188 Other pneumonia, unspecified organism: Secondary | ICD-10-CM | POA: Diagnosis not present

## 2021-04-05 DIAGNOSIS — L8944 Pressure ulcer of contiguous site of back, buttock and hip, stage 4: Secondary | ICD-10-CM | POA: Diagnosis not present

## 2021-04-05 DIAGNOSIS — R262 Difficulty in walking, not elsewhere classified: Secondary | ICD-10-CM | POA: Diagnosis not present

## 2021-04-05 DIAGNOSIS — S72145D Nondisplaced intertrochanteric fracture of left femur, subsequent encounter for closed fracture with routine healing: Secondary | ICD-10-CM | POA: Diagnosis not present

## 2021-04-05 DIAGNOSIS — M6281 Muscle weakness (generalized): Secondary | ICD-10-CM | POA: Diagnosis not present

## 2021-04-05 DIAGNOSIS — R531 Weakness: Secondary | ICD-10-CM | POA: Diagnosis not present

## 2021-04-05 DIAGNOSIS — J479 Bronchiectasis, uncomplicated: Secondary | ICD-10-CM | POA: Diagnosis not present

## 2021-04-05 DIAGNOSIS — R2689 Other abnormalities of gait and mobility: Secondary | ICD-10-CM | POA: Diagnosis not present

## 2021-04-05 DIAGNOSIS — E43 Unspecified severe protein-calorie malnutrition: Secondary | ICD-10-CM | POA: Diagnosis not present

## 2021-04-05 DIAGNOSIS — S0101XD Laceration without foreign body of scalp, subsequent encounter: Secondary | ICD-10-CM | POA: Diagnosis not present

## 2021-04-05 DIAGNOSIS — M87051 Idiopathic aseptic necrosis of right femur: Secondary | ICD-10-CM | POA: Diagnosis not present

## 2021-04-05 DIAGNOSIS — L89106 Pressure-induced deep tissue damage of unspecified part of back: Secondary | ICD-10-CM | POA: Diagnosis not present

## 2021-04-05 DIAGNOSIS — E441 Mild protein-calorie malnutrition: Secondary | ICD-10-CM | POA: Diagnosis not present

## 2021-04-05 DIAGNOSIS — L89103 Pressure ulcer of unspecified part of back, stage 3: Secondary | ICD-10-CM | POA: Diagnosis not present

## 2021-04-05 DIAGNOSIS — G8918 Other acute postprocedural pain: Secondary | ICD-10-CM | POA: Diagnosis not present

## 2021-04-05 DIAGNOSIS — L89154 Pressure ulcer of sacral region, stage 4: Secondary | ICD-10-CM | POA: Diagnosis not present

## 2021-04-05 DIAGNOSIS — L89152 Pressure ulcer of sacral region, stage 2: Secondary | ICD-10-CM | POA: Diagnosis not present

## 2021-04-05 DIAGNOSIS — D649 Anemia, unspecified: Secondary | ICD-10-CM | POA: Diagnosis not present

## 2021-04-05 DIAGNOSIS — I959 Hypotension, unspecified: Secondary | ICD-10-CM | POA: Diagnosis not present

## 2021-04-05 DIAGNOSIS — S72142D Displaced intertrochanteric fracture of left femur, subsequent encounter for closed fracture with routine healing: Secondary | ICD-10-CM | POA: Diagnosis not present

## 2021-04-05 DIAGNOSIS — F4320 Adjustment disorder, unspecified: Secondary | ICD-10-CM | POA: Diagnosis not present

## 2021-04-05 DIAGNOSIS — R269 Unspecified abnormalities of gait and mobility: Secondary | ICD-10-CM | POA: Diagnosis not present

## 2021-04-05 DIAGNOSIS — R011 Cardiac murmur, unspecified: Secondary | ICD-10-CM | POA: Diagnosis not present

## 2021-04-05 DIAGNOSIS — S0003XD Contusion of scalp, subsequent encounter: Secondary | ICD-10-CM | POA: Diagnosis not present

## 2021-04-05 DIAGNOSIS — S72142A Displaced intertrochanteric fracture of left femur, initial encounter for closed fracture: Secondary | ICD-10-CM | POA: Diagnosis not present

## 2021-04-05 DIAGNOSIS — Z981 Arthrodesis status: Secondary | ICD-10-CM | POA: Diagnosis not present

## 2021-04-05 DIAGNOSIS — M4628 Osteomyelitis of vertebra, sacral and sacrococcygeal region: Secondary | ICD-10-CM | POA: Diagnosis not present

## 2021-04-05 DIAGNOSIS — Z8781 Personal history of (healed) traumatic fracture: Secondary | ICD-10-CM | POA: Diagnosis not present

## 2021-04-05 DIAGNOSIS — Z96641 Presence of right artificial hip joint: Secondary | ICD-10-CM | POA: Diagnosis not present

## 2021-04-05 DIAGNOSIS — Z7401 Bed confinement status: Secondary | ICD-10-CM | POA: Diagnosis not present

## 2021-04-05 DIAGNOSIS — S72415A Nondisplaced unspecified condyle fracture of lower end of left femur, initial encounter for closed fracture: Secondary | ICD-10-CM | POA: Diagnosis not present

## 2021-04-05 DIAGNOSIS — W19XXXD Unspecified fall, subsequent encounter: Secondary | ICD-10-CM | POA: Diagnosis not present

## 2021-04-06 DIAGNOSIS — R262 Difficulty in walking, not elsewhere classified: Secondary | ICD-10-CM | POA: Diagnosis not present

## 2021-04-06 DIAGNOSIS — S72142D Displaced intertrochanteric fracture of left femur, subsequent encounter for closed fracture with routine healing: Secondary | ICD-10-CM | POA: Diagnosis not present

## 2021-04-06 DIAGNOSIS — D649 Anemia, unspecified: Secondary | ICD-10-CM | POA: Diagnosis not present

## 2021-04-06 DIAGNOSIS — G8918 Other acute postprocedural pain: Secondary | ICD-10-CM | POA: Diagnosis not present

## 2021-04-11 DIAGNOSIS — S72142A Displaced intertrochanteric fracture of left femur, initial encounter for closed fracture: Secondary | ICD-10-CM | POA: Diagnosis not present

## 2021-04-11 DIAGNOSIS — L89152 Pressure ulcer of sacral region, stage 2: Secondary | ICD-10-CM | POA: Diagnosis not present

## 2021-04-11 DIAGNOSIS — L89106 Pressure-induced deep tissue damage of unspecified part of back: Secondary | ICD-10-CM | POA: Diagnosis not present

## 2021-04-13 DIAGNOSIS — F411 Generalized anxiety disorder: Secondary | ICD-10-CM | POA: Diagnosis not present

## 2021-04-13 DIAGNOSIS — F4320 Adjustment disorder, unspecified: Secondary | ICD-10-CM | POA: Diagnosis not present

## 2021-04-18 DIAGNOSIS — L89154 Pressure ulcer of sacral region, stage 4: Secondary | ICD-10-CM | POA: Diagnosis not present

## 2021-04-18 DIAGNOSIS — L89106 Pressure-induced deep tissue damage of unspecified part of back: Secondary | ICD-10-CM | POA: Diagnosis not present

## 2021-04-25 DIAGNOSIS — L89106 Pressure-induced deep tissue damage of unspecified part of back: Secondary | ICD-10-CM | POA: Diagnosis not present

## 2021-04-25 DIAGNOSIS — L89154 Pressure ulcer of sacral region, stage 4: Secondary | ICD-10-CM | POA: Diagnosis not present

## 2021-04-26 ENCOUNTER — Ambulatory Visit: Payer: Medicare Other | Admitting: Infectious Disease

## 2021-04-29 DIAGNOSIS — L89103 Pressure ulcer of unspecified part of back, stage 3: Secondary | ICD-10-CM | POA: Diagnosis not present

## 2021-04-29 DIAGNOSIS — Z981 Arthrodesis status: Secondary | ICD-10-CM | POA: Diagnosis not present

## 2021-04-29 DIAGNOSIS — J479 Bronchiectasis, uncomplicated: Secondary | ICD-10-CM | POA: Diagnosis not present

## 2021-04-29 DIAGNOSIS — R2689 Other abnormalities of gait and mobility: Secondary | ICD-10-CM | POA: Diagnosis not present

## 2021-04-29 DIAGNOSIS — S72145D Nondisplaced intertrochanteric fracture of left femur, subsequent encounter for closed fracture with routine healing: Secondary | ICD-10-CM | POA: Diagnosis not present

## 2021-04-29 DIAGNOSIS — Z96641 Presence of right artificial hip joint: Secondary | ICD-10-CM | POA: Diagnosis not present

## 2021-04-29 DIAGNOSIS — A498 Other bacterial infections of unspecified site: Secondary | ICD-10-CM | POA: Diagnosis not present

## 2021-04-29 DIAGNOSIS — M4628 Osteomyelitis of vertebra, sacral and sacrococcygeal region: Secondary | ICD-10-CM | POA: Diagnosis not present

## 2021-04-29 DIAGNOSIS — Z8781 Personal history of (healed) traumatic fracture: Secondary | ICD-10-CM | POA: Diagnosis not present

## 2021-04-29 DIAGNOSIS — W19XXXD Unspecified fall, subsequent encounter: Secondary | ICD-10-CM | POA: Diagnosis not present

## 2021-04-29 DIAGNOSIS — L89154 Pressure ulcer of sacral region, stage 4: Secondary | ICD-10-CM | POA: Diagnosis not present

## 2021-04-29 DIAGNOSIS — M6281 Muscle weakness (generalized): Secondary | ICD-10-CM | POA: Diagnosis not present

## 2021-04-29 DIAGNOSIS — L8944 Pressure ulcer of contiguous site of back, buttock and hip, stage 4: Secondary | ICD-10-CM | POA: Diagnosis not present

## 2021-04-29 DIAGNOSIS — S0003XD Contusion of scalp, subsequent encounter: Secondary | ICD-10-CM | POA: Diagnosis not present

## 2021-04-29 DIAGNOSIS — E43 Unspecified severe protein-calorie malnutrition: Secondary | ICD-10-CM | POA: Diagnosis not present

## 2021-05-02 DIAGNOSIS — L89154 Pressure ulcer of sacral region, stage 4: Secondary | ICD-10-CM | POA: Diagnosis not present

## 2021-05-02 DIAGNOSIS — L89103 Pressure ulcer of unspecified part of back, stage 3: Secondary | ICD-10-CM | POA: Diagnosis not present

## 2021-05-03 ENCOUNTER — Telehealth: Payer: Self-pay

## 2021-05-03 ENCOUNTER — Encounter: Payer: Self-pay | Admitting: Infectious Disease

## 2021-05-03 ENCOUNTER — Ambulatory Visit (HOSPITAL_COMMUNITY): Payer: Medicare Other | Admitting: Infectious Disease

## 2021-05-03 ENCOUNTER — Other Ambulatory Visit: Payer: Self-pay

## 2021-05-03 VITALS — BP 91/60 | HR 84 | Temp 98.3°F | Resp 16 | Ht 62.0 in | Wt 96.0 lb

## 2021-05-03 DIAGNOSIS — J479 Bronchiectasis, uncomplicated: Secondary | ICD-10-CM | POA: Insufficient documentation

## 2021-05-03 DIAGNOSIS — L89103 Pressure ulcer of unspecified part of back, stage 3: Secondary | ICD-10-CM

## 2021-05-03 DIAGNOSIS — L8944 Pressure ulcer of contiguous site of back, buttock and hip, stage 4: Secondary | ICD-10-CM

## 2021-05-03 DIAGNOSIS — Z8781 Personal history of (healed) traumatic fracture: Secondary | ICD-10-CM

## 2021-05-03 DIAGNOSIS — A498 Other bacterial infections of unspecified site: Secondary | ICD-10-CM

## 2021-05-03 DIAGNOSIS — L8994 Pressure ulcer of unspecified site, stage 4: Secondary | ICD-10-CM

## 2021-05-03 DIAGNOSIS — Z96641 Presence of right artificial hip joint: Secondary | ICD-10-CM | POA: Diagnosis not present

## 2021-05-03 DIAGNOSIS — Z981 Arthrodesis status: Secondary | ICD-10-CM | POA: Diagnosis not present

## 2021-05-03 DIAGNOSIS — M4628 Osteomyelitis of vertebra, sacral and sacrococcygeal region: Secondary | ICD-10-CM

## 2021-05-03 HISTORY — DX: Pressure ulcer of unspecified site, stage 4: L89.94

## 2021-05-03 HISTORY — DX: Osteomyelitis of vertebra, sacral and sacrococcygeal region: M46.28

## 2021-05-03 HISTORY — DX: Presence of right artificial hip joint: Z96.641

## 2021-05-03 HISTORY — DX: Pressure ulcer of unspecified part of back, stage 3: L89.103

## 2021-05-03 HISTORY — DX: Bronchiectasis, uncomplicated: J47.9

## 2021-05-03 HISTORY — DX: Personal history of (healed) traumatic fracture: Z87.81

## 2021-05-03 HISTORY — DX: Other bacterial infections of unspecified site: A49.8

## 2021-05-03 HISTORY — DX: Arthrodesis status: Z98.1

## 2021-05-03 MED ORDER — DOXYCYCLINE HYCLATE 100 MG PO TABS: 100.0000 mg | ORAL_TABLET | Freq: Two times a day (BID) | ORAL | 1 refills | Status: AC

## 2021-05-03 NOTE — Telephone Encounter (Signed)
[facility] [phone] and left vm with Director of Nursing. Need to know if patient is being discharged this Friday. We have to set up PICC placement and care and if they are discharging we need to know plan before setting up the IV administration plans.   I also faxed orders to [facility] (and emailed Jeani Hawking and Pam)so they can begin Authorization process for the 1G [place] daily x 6 weeks. Labs: BMP, W GFR, CBC/W DIFF, CRP,ESR.  Please send message to Theresia Majors regarding info if DON form [facility] calls back. Thanks

## 2021-05-03 NOTE — Telephone Encounter (Signed)
[facility] called to confirm that PICC line will be placed at the facility today.   Beryle Flock, RN

## 2021-05-03 NOTE — Progress Notes (Signed)
Subjective:  Reason for infectious disease consult:  Stage IV decubitus ulcer with osteomyelitis  Requesting physician is Laverna Peace, NP   Patient ID: Jasmin Anderson, female    DOB: Aug 26, 1935, 85 y.o.   MRN: [id number]  HPI  Jasmin Anderson is an 85 year old Caucasian lady with multiple medical problems [facility].  She has had unfortunate time recently due to her osteoporosis having had a compression fracture of her thoracic spine that is post T7-8 T9 kyphoplasty in February 2020.  He is subsequently sustained a fall and fractured her right hip which initially was treated with ORIF but this apparently failed and she ultimately ended up having total hip arthroplasty on the right side.  This September 2022 she fell and fractured her left hip which required placement of pins.  She has been residing at [facility].  She has developed a stage IV decubitus ulcer in her sacral region due to the fact that she sits for prolonged periods of time.  She is able to get up and walk some though less so with her hip fractures.  She has a specialized air mattress at the skilled nursing facility but unfortunately she cannot lie in the bed well due to her bronchiectasis.  She gets severely dyspneic if she lies down and begins coughing.  Therefore she spent the majority of her time sitting.  She had a wound culture taken from the decubitus ulcer which yielded Enterococcus bacteria cloacae which was resistant to cefazolin but otherwise sensitive.  She has been started on doxycycline and ciprofloxacin.  She is referred to our clinic for management of sacral osteomyelitis.     Past Medical History:  Diagnosis Date   Anxiety    situational   Arthritis    Bronchiectasis ([place])    Bronchiectasis ([place]) 05/03/2021   Compression fracture of body of thoracic vertebra (HCC)    Decubitus ulcer of back, stage 3 ([place]) 05/03/2021   Esophageal tear    during hiatal hernia surgery    GERD (gastroesophageal reflux disease)    History of lumbar fusion 05/03/2021   Hx of total hip arthroplasty, right 05/03/2021   Infection caused by Enterobacter cloacae 05/03/2021   Pneumonia    S/p left hip fracture 05/03/2021   Sacral osteomyelitis ([place]) 05/03/2021   Stage IV decubitus ulcer ([place]) 05/03/2021   Wears glasses    Wears partial dentures     Past Surgical History:  Procedure Laterality Date   APPENDECTOMY     CATARACT EXTRACTION W/ INTRAOCULAR LENS  IMPLANT, BILATERAL     CESAREAN SECTION     x 2   CHOLECYSTECTOMY     COLONOSCOPY     EYE SURGERY     FOOT NEUROMA SURGERY     x 2   FRACTURE SURGERY     HERNIA REPAIR     hiatal hernia   KYPHOPLASTY Bilateral 09/09/2018   Procedure: Kyphoplasty - T7 - T8 - T9;  Surgeon: Consuella Lose, MD;  Location: [facility];  Service: Neurosurgery;  Laterality: Bilateral;   MULTIPLE TOOTH EXTRACTIONS     TUBAL LIGATION      No family history on file.    Social History   Socioeconomic History   Marital status: Widowed    Spouse name: Not on file   Number of children: Not on file   Years of education: 12   Highest education level: Some college, no degree  Occupational History   Not on file  Tobacco Use   Smoking status: Never   Smokeless tobacco: Never  Vaping Use   Vaping Use: Never used  Substance and Sexual Activity   Alcohol use: Not Currently    Comment: rare   Drug use: Never   Sexual activity: Not on file  Other Topics Concern   Not on file  Social History Narrative   Not on file   Social Determinants of Health   Financial Resource Strain: Not on file  Food Insecurity: Not on file  Transportation Needs: Not on file  Physical Activity: Not on file  Stress: Not on file  Social Connections: Not on file    Allergies  Allergen Reactions   Codeine Palpitations   Gabapentin Swelling    Foot swelling     Current Outpatient Medications:    acetaminophen (TYLENOL) 650 MG CR tablet, Take 1,300 mg by  mouth 2 (two) times daily., Disp: , Rfl:    ALPRAZolam (XANAX) 0.25 MG tablet, Take 0.25 mg by mouth daily as needed for anxiety., Disp: , Rfl:    CALCIUM CARB-CHOLECALCIFEROL PO, Take 500 mg by mouth., Disp: , Rfl:    ELIQUIS 2.5 MG TABS tablet, Take 2.5 mg by mouth 2 (two) times daily., Disp: , Rfl:    ferrous fumarate (HEMOCYTE - 106 MG FE) 325 (106 Fe) MG TABS tablet, Take 1 tablet by mouth 2 (two) times daily., Disp: , Rfl:    mirtazapine (REMERON) 7.5 mg TABS tablet, Take 7.5 mg by mouth at bedtime., Disp: , Rfl:    Nutritional Supplements (PROTEIN SUPPLEMENT 80% PO), Take by mouth., Disp: , Rfl:    OVER THE COUNTER MEDICATION, Take 2 tablets by mouth daily. Clear lung otc supplement, Disp: , Rfl:    oxyCODONE (OXY IR/ROXICODONE) 5 MG immediate release tablet, Take 5 mg by mouth every 6 (six) hours as needed for pain., Disp: , Rfl:    pantoprazole (PROTONIX) 40 MG tablet, Take 40 mg by mouth daily., Disp: , Rfl:    sennosides-docusate sodium (SENOKOT-S) 8.6-50 MG tablet, Take 1 tablet by mouth daily., Disp: , Rfl:    traMADol (ULTRAM) 50 MG tablet, Take 50 mg by mouth every 6 (six) hours as needed., Disp: , Rfl:    vitamin C (ASCORBIC ACID) 250 MG tablet, Take 250 mg by mouth daily., Disp: , Rfl:    Vitamin D, Ergocalciferol, (DRISDOL) 1.25 MG (50000 UNIT) CAPS capsule, Take 50,000 Units by mouth once a week., Disp: , Rfl:    Cyanocobalamin (B-12 COMPLIANCE INJECTION IJ), Inject 1 Dose as directed every 30 (thirty) days. (Patient not taking: Reported on 05/03/2021), Disp: , Rfl:    denosumab (PROLIA) 60 MG/ML SOSY injection, Inject 60 mg into the skin every 6 (six) months. (Patient not taking: Reported on 05/03/2021), Disp: , Rfl:    esomeprazole (NEXIUM) 40 MG capsule, Take 40 mg by mouth daily.  (Patient not taking: Reported on 05/03/2021), Disp: , Rfl:    Review of Systems  Constitutional:  Negative for activity change, appetite change, chills, diaphoresis, fatigue, fever and unexpected  weight change.  HENT:  Negative for congestion, rhinorrhea, sinus pressure, sneezing, sore throat and trouble swallowing.   Eyes:  Negative for photophobia and visual disturbance.  Respiratory:  Positive for cough. Negative for chest tightness, shortness of breath, wheezing and stridor.   Cardiovascular:  Negative for chest pain, palpitations and leg swelling.  Gastrointestinal:  Positive for constipation. Negative for abdominal distention, abdominal pain, anal bleeding, blood in stool, diarrhea, nausea and vomiting.  Genitourinary:  Negative for difficulty urinating, dysuria, flank pain and hematuria.  Musculoskeletal:  Positive for back pain and myalgias. Negative for arthralgias, gait problem and joint swelling.  Skin:  Positive for wound. Negative for color change, pallor and rash.  Neurological:  Negative for dizziness, tremors, weakness and light-headedness.  Hematological:  Negative for adenopathy. Does not bruise/bleed easily.  Psychiatric/Behavioral:  Negative for agitation, behavioral problems, confusion, decreased concentration, dysphoric mood and sleep disturbance.       Objective:   Physical Exam Constitutional:      General: She is not in acute distress.    Appearance: Normal appearance. She is well-developed. She is not ill-appearing or diaphoretic.  HENT:     Head: Normocephalic and atraumatic.     Right Ear: Hearing and external ear normal.     Left Ear: Hearing and external ear normal.     Nose: No nasal deformity or rhinorrhea.  Eyes:     General: No scleral icterus.    Conjunctiva/sclera: Conjunctivae normal.     Right eye: Right conjunctiva is not injected.     Left eye: Left conjunctiva is not injected.     Pupils: Pupils are equal, round, and reactive to light.  Neck:     Vascular: No JVD.  Cardiovascular:     Rate and Rhythm: Normal rate and regular rhythm.     Heart sounds: Normal heart sounds, S1 normal and S2 normal. No murmur heard.   No friction rub. No  gallop.  Pulmonary:     Effort: No respiratory distress.     Breath sounds: No stridor. Rhonchi present.  Abdominal:     General: Bowel sounds are normal. There is no distension.     Palpations: Abdomen is soft.     Tenderness: There is no abdominal tenderness.  Musculoskeletal:        General: Normal range of motion.     Right shoulder: Normal.     Left shoulder: Normal.     Cervical back: Normal range of motion and neck supple.     Right hip: Normal.     Left hip: Normal.     Right knee: Normal.     Left knee: Normal.  Lymphadenopathy:     Head:     Right side of head: No submandibular, preauricular or posterior auricular adenopathy.     Left side of head: No submandibular, preauricular or posterior auricular adenopathy.     Cervical: No cervical adenopathy.     Right cervical: No superficial or deep cervical adenopathy.    Left cervical: No superficial or deep cervical adenopathy.  Skin:    General: Skin is warm and dry.     Coloration: Skin is not pale.     Findings: No abrasion, bruising, ecchymosis, erythema, lesion or rash.     Nails: There is no clubbing.  Neurological:     Mental Status: She is alert and oriented to person, place, and time.     Sensory: No sensory deficit.     Coordination: Coordination normal.     Gait: Gait normal.  Psychiatric:        Attention and Perception: She is attentive.        Mood and Affect: Mood normal.        Speech: Speech normal.        Behavior: Behavior normal. Behavior is cooperative.        Thought Content: Thought content normal.        Judgment: Judgment  normal.   Severe kyphosis:  Upper area of stage III decubitus ulcers around vertebra 05/03/2021:      Sacral area 05/03/2021:          Assessment & Plan:   Stage IV decubitus ulcer with osteomyelitis:  It sounds as if she may have had a bone culture performed at [facility].  Would like to get the results of that culture.  I have discussed  treatment options for her that include IV antibiotics but most importantly offloading the area where the pressure ulcers have been developing.   We unpacked wound and it needs re-packing  She clearly needs proper padding for where she sits and for also protection of her back.  I will make a referral to wound care she needs optimization of her nutrition.  We discussed pros and cons of IV versus oral antibiotics.  She would like to proceed with aggressive care with IV antibiotics.  I will put in an order for interventional radiology to place a PICC line.  We will have her given first dose of IV ertapenem at short stay and then I would like to go with a regimen of doxycycline twice daily plus once daily ertapenem with 1 g of treatment.  We will check a sed rate CRP CMP and CBC with differential today.  Like to treat her for 6 weeks and again we will adjust antibiotics and we have further culture data to help [place].    On blood thinner Eliquis this should mitigate risk of DVT associated with PICC after it is placed  Bronchiectasis: This is clearly a big issue because it causes her to not build to lie and offload the area and causes her to sit frequently.    I spent 82 minutes with the patient including than 50% of the time in face to face counseling of the patient and her foster daughter regarding the nature of osteomyelitis and the need for multidisciplinary treatment of it how we interpret wound cultures bone cultures and how we treat with IV antibiotics offloading optimization of nutrition wound care personally reviewing her CT of the abdomen pelvis the November 01, 2020 culture data from [facility], along with along with review of medical records in preparation for the visit and during the visit and in coordination of her care.

## 2021-05-03 NOTE — Telephone Encounter (Signed)
Advised Jasmin Anderson at [facility] to cancel Auth request for IV abx since Megan informed me that facility will insert PIcc tomorrow and handle the care of picc line and abx.

## 2021-05-08 DIAGNOSIS — M4628 Osteomyelitis of vertebra, sacral and sacrococcygeal region: Secondary | ICD-10-CM | POA: Diagnosis not present

## 2021-05-08 DIAGNOSIS — R5381 Other malaise: Secondary | ICD-10-CM | POA: Diagnosis not present

## 2021-05-08 DIAGNOSIS — E441 Mild protein-calorie malnutrition: Secondary | ICD-10-CM | POA: Diagnosis not present

## 2021-05-08 DIAGNOSIS — L89103 Pressure ulcer of unspecified part of back, stage 3: Secondary | ICD-10-CM | POA: Diagnosis not present

## 2021-05-09 ENCOUNTER — Other Ambulatory Visit: Payer: Self-pay | Admitting: *Deleted

## 2021-05-09 DIAGNOSIS — S72142A Displaced intertrochanteric fracture of left femur, initial encounter for closed fracture: Secondary | ICD-10-CM | POA: Diagnosis not present

## 2021-05-09 NOTE — Patient Outreach (Signed)
Member screened for potential [facility] Care Management needs. Per [facility] member resides in [facility].   Communication sent to facility SNF SW to inquire about transition plans and potential [facility] Care Management services.   Will continue to follow.   Marthenia Rolling, MSN, RN,BSN [place] Acute Care Coordinator [phone] [facility]) [phone]  (Toll free office)

## 2021-05-31 DIAGNOSIS — L89154 Pressure ulcer of sacral region, stage 4: Secondary | ICD-10-CM | POA: Diagnosis not present

## 2021-05-31 DIAGNOSIS — L89103 Pressure ulcer of unspecified part of back, stage 3: Secondary | ICD-10-CM | POA: Diagnosis not present

## 2021-06-06 ENCOUNTER — Other Ambulatory Visit: Payer: Self-pay

## 2021-06-06 ENCOUNTER — Ambulatory Visit (INDEPENDENT_AMBULATORY_CARE_PROVIDER_SITE_OTHER): Payer: Medicare Other | Admitting: Infectious Disease

## 2021-06-06 ENCOUNTER — Other Ambulatory Visit: Payer: Self-pay | Admitting: *Deleted

## 2021-06-06 VITALS — BP 108/73 | HR 101 | Temp 97.4°F

## 2021-06-06 DIAGNOSIS — L89103 Pressure ulcer of unspecified part of back, stage 3: Secondary | ICD-10-CM | POA: Diagnosis not present

## 2021-06-06 DIAGNOSIS — J479 Bronchiectasis, uncomplicated: Secondary | ICD-10-CM

## 2021-06-06 DIAGNOSIS — Z8781 Personal history of (healed) traumatic fracture: Secondary | ICD-10-CM | POA: Diagnosis not present

## 2021-06-06 DIAGNOSIS — M4628 Osteomyelitis of vertebra, sacral and sacrococcygeal region: Secondary | ICD-10-CM | POA: Diagnosis not present

## 2021-06-06 DIAGNOSIS — Z981 Arthrodesis status: Secondary | ICD-10-CM

## 2021-06-06 DIAGNOSIS — A498 Other bacterial infections of unspecified site: Secondary | ICD-10-CM | POA: Diagnosis not present

## 2021-06-06 NOTE — Patient Outreach (Signed)
Member screened for potential [facility] Care Management needs. Jasmin Anderson resides in [facility].   Update received from [facility] indicating Jasmin Anderson's transition plan is to return home after iv antibiotics are completed. Likely transition to home next week per SNF SW. [facility] Care Management follow up recommended.   Writer will plan outreach to discuss potential [place] Management services.    Marthenia Rolling, MSN, RN,BSN [place] Acute Care Coordinator [phone] [facility]) [phone]  (Toll free office)

## 2021-06-06 NOTE — Progress Notes (Signed)
Subjective:   Chief complaint follow-up for decubitus ulcers and stage IV decubitus ulcer with osteomyelitis  Patient ID: Jasmin Anderson, female    DOB: 05-06-1936, 85 y.o.   MRN: [id number]  HPI  Keyunna is an 85 year old Caucasian lady with multiple medical problems [facility].  She has had unfortunate time recently due to her osteoporosis having had a compression fracture of her thoracic spine that is post T7-8 T9 kyphoplasty in February 2020.  He is subsequently sustained a fall and fractured her right hip which initially was treated with ORIF but this apparently failed and she ultimately ended up having total hip arthroplasty on the right side.  This September 2022 she fell and fractured her left hip which required placement of pins.  She has been residing at [facility].  She has developed a stage IV decubitus ulcer in her sacral region due to the fact that she sits for prolonged periods of time.  She is able to get up and walk some though less so with her hip fractures.  She has a specialized air mattress at the skilled nursing facility but unfortunately she cannot lie in the bed well due to her bronchiectasis.  She gets severely dyspneic if she lies down and begins coughing.  Therefore she spent the majority of her time sitting.  She had a wound culture taken from the decubitus ulcer which yielded Enterococcus cloacae which was resistant to cefazolin but otherwise sensitive.  She has been started on doxycycline and ciprofloxacin.  She is referred to our clinic for management of sacral osteomyelitis.  I saw her in clinic and we started on IV ertapenem along with doxycycline to cover both the Enterobacter anaerobes and potentially staph epi or MRSA though I doubt MRSA is a culprit if it was not isolated on bone culture.  She says that the wound is improving per the staff at the skilled nursing facility although it is hard for me to appreciate  that is improved I have not been seeing her every day.  She still however is sitting in the area which course is making it difficult in terms of applying pressure which was the cause of this decubitus ulcer.  She also has an ulcer further up that her daughter pointed out that she states her Mom has  developed as well in the interim though I believe is 1 that I saw previously when I examined her.       Past Medical History:  Diagnosis Date   Anxiety    situational   Arthritis    Bronchiectasis ([place])    Bronchiectasis ([place]) 05/03/2021   Compression fracture of body of thoracic vertebra (HCC)    Decubitus ulcer of back, stage 3 ([place]) 05/03/2021   Esophageal tear    during hiatal hernia surgery   GERD (gastroesophageal reflux disease)    History of lumbar fusion 05/03/2021   Hx of total hip arthroplasty, right 05/03/2021   Infection caused by Enterobacter cloacae 05/03/2021   Pneumonia    S/p left hip fracture 05/03/2021   Sacral osteomyelitis ([place]) 05/03/2021   Stage IV decubitus ulcer ([place]) 05/03/2021   Wears glasses    Wears partial dentures     Past Surgical History:  Procedure Laterality Date   APPENDECTOMY     CATARACT EXTRACTION W/ INTRAOCULAR LENS  IMPLANT, BILATERAL     CESAREAN SECTION     x 2   CHOLECYSTECTOMY     COLONOSCOPY  EYE SURGERY     FOOT NEUROMA SURGERY     x 2   FRACTURE SURGERY     HERNIA REPAIR     hiatal hernia   KYPHOPLASTY Bilateral 09/09/2018   Procedure: Kyphoplasty - T7 - T8 - T9;  Surgeon: Consuella Lose, MD;  Location: [facility];  Service: Neurosurgery;  Laterality: Bilateral;   MULTIPLE TOOTH EXTRACTIONS     TUBAL LIGATION      No family history on file.    Social History   Socioeconomic History   Marital status: Widowed    Spouse name: Not on file   Number of children: Not on file   Years of education: 12   Highest education level: Some college, no degree  Occupational History   Not on file  Tobacco Use   Smoking status:  Never   Smokeless tobacco: Never  Vaping Use   Vaping Use: Never used  Substance and Sexual Activity   Alcohol use: Not Currently    Comment: rare   Drug use: Never   Sexual activity: Not on file  Other Topics Concern   Not on file  Social History Narrative   Not on file   Social Determinants of Health   Financial Resource Strain: Not on file  Food Insecurity: Not on file  Transportation Needs: Not on file  Physical Activity: Not on file  Stress: Not on file  Social Connections: Not on file    Allergies  Allergen Reactions   Codeine Palpitations   Gabapentin Swelling    Foot swelling     Current Outpatient Medications:    acetaminophen (TYLENOL) 650 MG CR tablet, Take 1,300 mg by mouth 2 (two) times daily., Disp: , Rfl:    ALPRAZolam (XANAX) 0.25 MG tablet, Take 0.25 mg by mouth daily as needed for anxiety., Disp: , Rfl:    CALCIUM CARB-CHOLECALCIFEROL PO, Take 500 mg by mouth., Disp: , Rfl:    ELIQUIS 2.5 MG TABS tablet, Take 2.5 mg by mouth 2 (two) times daily., Disp: , Rfl:    ferrous fumarate (HEMOCYTE - 106 MG FE) 325 (106 Fe) MG TABS tablet, Take 1 tablet by mouth 2 (two) times daily., Disp: , Rfl:    mirtazapine (REMERON) 7.5 mg TABS tablet, Take 7.5 mg by mouth at bedtime., Disp: , Rfl:    Nutritional Supplements (PROTEIN SUPPLEMENT 80% PO), Take by mouth., Disp: , Rfl:    OVER THE COUNTER MEDICATION, Take 2 tablets by mouth daily. Clear lung otc supplement, Disp: , Rfl:    oxyCODONE (OXY IR/ROXICODONE) 5 MG immediate release tablet, Take 5 mg by mouth every 6 (six) hours as needed for pain., Disp: , Rfl:    pantoprazole (PROTONIX) 40 MG tablet, Take 40 mg by mouth daily., Disp: , Rfl:    sennosides-docusate sodium (SENOKOT-S) 8.6-50 MG tablet, Take 1 tablet by mouth daily., Disp: , Rfl:    traMADol (ULTRAM) 50 MG tablet, Take 50 mg by mouth every 6 (six) hours as needed., Disp: , Rfl:    vitamin C (ASCORBIC ACID) 250 MG tablet, Take 250 mg by mouth daily., Disp:  , Rfl:    Vitamin D, Ergocalciferol, (DRISDOL) 1.25 MG (50000 UNIT) CAPS capsule, Take 50,000 Units by mouth once a week., Disp: , Rfl:    Cyanocobalamin (B-12 COMPLIANCE INJECTION IJ), Inject 1 Dose as directed every 30 (thirty) days. (Patient not taking: No sig reported), Disp: , Rfl:    denosumab (PROLIA) 60 MG/ML SOSY injection, Inject 60 mg into the  skin every 6 (six) months. (Patient not taking: No sig reported), Disp: , Rfl:    doxycycline (VIBRA-TABS) 100 MG tablet, Take 1 tablet (100 mg total) by mouth 2 (two) times daily. (Patient not taking: Reported on 06/06/2021), Disp: 84 tablet, Rfl: 1   esomeprazole (NEXIUM) 40 MG capsule, Take 40 mg by mouth daily.  (Patient not taking: No sig reported), Disp: , Rfl:    Review of Systems  Constitutional:  Negative for activity change, appetite change, chills, diaphoresis, fatigue, fever and unexpected weight change.  HENT:  Negative for congestion, rhinorrhea, sinus pressure, sneezing, sore throat and trouble swallowing.   Eyes:  Negative for photophobia and visual disturbance.  Respiratory:  Positive for cough. Negative for chest tightness, shortness of breath, wheezing and stridor.   Cardiovascular:  Negative for chest pain, palpitations and leg swelling.  Gastrointestinal:  Negative for abdominal distention, abdominal pain, anal bleeding, blood in stool, constipation, diarrhea, nausea and vomiting.  Genitourinary:  Negative for difficulty urinating, dysuria, flank pain and hematuria.  Musculoskeletal:  Negative for arthralgias, back pain, gait problem, joint swelling and myalgias.  Skin:  Positive for wound. Negative for color change, pallor and rash.  Neurological:  Positive for weakness. Negative for dizziness, tremors and light-headedness.  Hematological:  Negative for adenopathy. Does not bruise/bleed easily.  Psychiatric/Behavioral:  Negative for agitation, behavioral problems, confusion, decreased concentration, dysphoric mood and sleep  disturbance.       Objective:   Physical Exam Constitutional:      General: She is not in acute distress.    Appearance: Normal appearance. She is underweight. She is not ill-appearing or diaphoretic.  HENT:     Head: Normocephalic and atraumatic.     Right Ear: Hearing and external ear normal.     Left Ear: Hearing and external ear normal.     Nose: No nasal deformity or rhinorrhea.  Eyes:     General: No scleral icterus.    Conjunctiva/sclera: Conjunctivae normal.     Right eye: Right conjunctiva is not injected.     Left eye: Left conjunctiva is not injected.     Pupils: Pupils are equal, round, and reactive to light.  Neck:     Vascular: No JVD.  Cardiovascular:     Rate and Rhythm: Normal rate and regular rhythm.     Heart sounds: S1 normal and S2 normal.  Pulmonary:     Effort: Pulmonary effort is normal. No respiratory distress.     Breath sounds: No wheezing.  Abdominal:     Palpations: Abdomen is soft.     Tenderness: There is no abdominal tenderness.  Musculoskeletal:        General: Normal range of motion.     Right shoulder: Normal.     Left shoulder: Normal.     Cervical back: Normal range of motion and neck supple.     Right hip: Normal.     Left hip: Normal.     Right knee: Normal.     Left knee: Normal.  Lymphadenopathy:     Head:     Right side of head: No submandibular, preauricular or posterior auricular adenopathy.     Left side of head: No submandibular, preauricular or posterior auricular adenopathy.     Cervical: No cervical adenopathy.     Right cervical: No superficial or deep cervical adenopathy.    Left cervical: No superficial or deep cervical adenopathy.  Skin:    General: Skin is warm and dry.  Coloration: Skin is not pale.     Findings: No abrasion, bruising, ecchymosis, erythema, lesion or rash.     Nails: There is no clubbing.  Neurological:     General: No focal deficit present.     Mental Status: She is alert and oriented to  person, place, and time.     Coordination: Coordination abnormal.  Psychiatric:        Attention and Perception: She is attentive.        Mood and Affect: Mood normal.        Speech: Speech normal.        Behavior: Behavior normal. Behavior is cooperative.        Thought Content: Thought content normal.        Judgment: Judgment normal.   Severe kyphosis:  Upper area of stage III decubitus ulcers around vertebra 05/03/2021:    Similar area today June 06, 2021:     Sacral area 05/03/2021:     06/06/2021:    PICC line 06/06/2021:       Assessment & Plan:   Stage IV decubitus ulcer with osteomyelitis: Stage IV decubitus ulcer with osteomyelitis:  Continue ertapenem which will cover the Enterobacter plus anaerobes along with doxycycline through November 17.  That point time PICC line should be pulled.  Antibiotic should be stopped and we will see her back in December.  She needs to continue to try to offload this area.   On blood thinner Eliquis this should mitigate risk of DVT associated with PICC after it is placed  Bronchiectasis: This is been one of the major problems causing to be difficult her to sleep.  Right arm injury this also makes it difficult as she was previously able to sleep on her right side though not on her back or left side she still is unable to sleep on the right side this point time.      \

## 2021-06-07 DIAGNOSIS — L89103 Pressure ulcer of unspecified part of back, stage 3: Secondary | ICD-10-CM | POA: Diagnosis not present

## 2021-06-07 DIAGNOSIS — L89154 Pressure ulcer of sacral region, stage 4: Secondary | ICD-10-CM | POA: Diagnosis not present

## 2021-06-13 ENCOUNTER — Other Ambulatory Visit: Payer: Self-pay | Admitting: *Deleted

## 2021-06-13 NOTE — Patient Outreach (Signed)
[place] Coordinator follow up. Member screened for potential [facility] Care Management needs. Mrs. Lynes resides in [place].   Telephone call made to Mrs. Chiarelli at [phone] to discuss [facility] Management follow up. Patient identifiers confirmed. Mrs. Brigham endorses she will return home in a few days.   Explained [facility] Management services. Mrs. Mcmahan was active in the past. Agreeable again to [facility] Management services. Lives alone. However, she states her daughter assists with transportation to MD appointments and the like.   Will plan to make referral to [facility]. SNF SW previously indicated Mrs. Luckow will transition home on 06/16/21.    Marthenia Rolling, MSN, RN,BSN [place] Acute Care Coordinator [phone] [facility]) [phone]  (Toll free office)

## 2021-06-14 DIAGNOSIS — M4628 Osteomyelitis of vertebra, sacral and sacrococcygeal region: Secondary | ICD-10-CM | POA: Diagnosis not present

## 2021-06-14 DIAGNOSIS — L89154 Pressure ulcer of sacral region, stage 4: Secondary | ICD-10-CM | POA: Diagnosis not present

## 2021-06-14 DIAGNOSIS — D649 Anemia, unspecified: Secondary | ICD-10-CM | POA: Diagnosis not present

## 2021-06-14 DIAGNOSIS — L89103 Pressure ulcer of unspecified part of back, stage 3: Secondary | ICD-10-CM | POA: Diagnosis not present

## 2021-06-14 DIAGNOSIS — I4891 Unspecified atrial fibrillation: Secondary | ICD-10-CM | POA: Diagnosis not present

## 2021-06-14 DIAGNOSIS — E441 Mild protein-calorie malnutrition: Secondary | ICD-10-CM | POA: Diagnosis not present

## 2021-06-16 ENCOUNTER — Other Ambulatory Visit: Payer: Self-pay | Admitting: *Deleted

## 2021-06-16 DIAGNOSIS — F411 Generalized anxiety disorder: Secondary | ICD-10-CM | POA: Diagnosis not present

## 2021-06-16 DIAGNOSIS — F4320 Adjustment disorder, unspecified: Secondary | ICD-10-CM | POA: Diagnosis not present

## 2021-06-16 NOTE — Patient Outreach (Signed)
Mrs. Hush resides in [facility]. Update received from [place], [facility] indicating member will remain in SNF a little longer due to wound care. Mrs. Bumgarner will not transition to home today after all.  Will continue to follow for transition date. Will plan to make referral to [facility] when member returns home.   Marthenia Rolling, MSN, RN,BSN [place] Acute Care Coordinator [phone] [facility]) [phone]  (Toll free office)

## 2021-06-21 DIAGNOSIS — I959 Hypotension, unspecified: Secondary | ICD-10-CM | POA: Diagnosis not present

## 2021-06-21 DIAGNOSIS — R000 Tachycardia, unspecified: Secondary | ICD-10-CM | POA: Diagnosis not present

## 2021-06-21 DIAGNOSIS — E860 Dehydration: Secondary | ICD-10-CM | POA: Diagnosis not present

## 2021-06-21 DIAGNOSIS — L89159 Pressure ulcer of sacral region, unspecified stage: Secondary | ICD-10-CM | POA: Diagnosis not present

## 2021-06-30 ENCOUNTER — Other Ambulatory Visit: Payer: Self-pay | Admitting: *Deleted

## 2021-06-30 DIAGNOSIS — G8929 Other chronic pain: Secondary | ICD-10-CM | POA: Diagnosis not present

## 2021-06-30 DIAGNOSIS — D539 Nutritional anemia, unspecified: Secondary | ICD-10-CM | POA: Diagnosis not present

## 2021-06-30 DIAGNOSIS — J479 Bronchiectasis, uncomplicated: Secondary | ICD-10-CM | POA: Diagnosis not present

## 2021-06-30 DIAGNOSIS — M4628 Osteomyelitis of vertebra, sacral and sacrococcygeal region: Secondary | ICD-10-CM | POA: Diagnosis not present

## 2021-06-30 DIAGNOSIS — E440 Moderate protein-calorie malnutrition: Secondary | ICD-10-CM | POA: Diagnosis not present

## 2021-06-30 DIAGNOSIS — Z79899 Other long term (current) drug therapy: Secondary | ICD-10-CM | POA: Diagnosis not present

## 2021-06-30 DIAGNOSIS — Z8781 Personal history of (healed) traumatic fracture: Secondary | ICD-10-CM | POA: Diagnosis not present

## 2021-06-30 DIAGNOSIS — L89154 Pressure ulcer of sacral region, stage 4: Secondary | ICD-10-CM | POA: Diagnosis not present

## 2021-06-30 DIAGNOSIS — M549 Dorsalgia, unspecified: Secondary | ICD-10-CM | POA: Diagnosis not present

## 2021-06-30 DIAGNOSIS — Z681 Body mass index (BMI) 19 or less, adult: Secondary | ICD-10-CM | POA: Diagnosis not present

## 2021-06-30 NOTE — Patient Outreach (Addendum)
[facility] Post- Acute Care Coordinator follow up. [name] previously resided in [facility]. Per [facility] [name] transitioned from [facility] to home on 06/20/22 and was seen in [facility] ED on 06/21/21 and subsequently transitioned home from ED.   [name] was previously agreeable to [facility] Management follow up while she was in [facility] on 06/13/21. Writer will make referral to [facility] for complex case management and care coordination.   Appears [name] has [facility].  Attempted to reach [name] on both telephone numbers on file. No answer.     Marthenia Rolling, MSN, RN,BSN [facility] Acute Care Coordinator [phone] [facility] [phone]  (Toll free office)

## 2021-07-04 DIAGNOSIS — M80052D Age-related osteoporosis with current pathological fracture, left femur, subsequent encounter for fracture with routine healing: Secondary | ICD-10-CM | POA: Diagnosis not present

## 2021-07-04 DIAGNOSIS — R634 Abnormal weight loss: Secondary | ICD-10-CM | POA: Diagnosis not present

## 2021-07-04 DIAGNOSIS — D539 Nutritional anemia, unspecified: Secondary | ICD-10-CM | POA: Diagnosis not present

## 2021-07-04 DIAGNOSIS — Z96641 Presence of right artificial hip joint: Secondary | ICD-10-CM | POA: Diagnosis not present

## 2021-07-04 DIAGNOSIS — E785 Hyperlipidemia, unspecified: Secondary | ICD-10-CM | POA: Diagnosis not present

## 2021-07-04 DIAGNOSIS — Z681 Body mass index (BMI) 19 or less, adult: Secondary | ICD-10-CM | POA: Diagnosis not present

## 2021-07-04 DIAGNOSIS — G8929 Other chronic pain: Secondary | ICD-10-CM | POA: Diagnosis not present

## 2021-07-04 DIAGNOSIS — G4700 Insomnia, unspecified: Secondary | ICD-10-CM | POA: Diagnosis not present

## 2021-07-04 DIAGNOSIS — H9190 Unspecified hearing loss, unspecified ear: Secondary | ICD-10-CM | POA: Diagnosis not present

## 2021-07-04 DIAGNOSIS — Z9181 History of falling: Secondary | ICD-10-CM | POA: Diagnosis not present

## 2021-07-04 DIAGNOSIS — M170 Bilateral primary osteoarthritis of knee: Secondary | ICD-10-CM | POA: Diagnosis not present

## 2021-07-04 DIAGNOSIS — Z86718 Personal history of other venous thrombosis and embolism: Secondary | ICD-10-CM | POA: Diagnosis not present

## 2021-07-04 DIAGNOSIS — E440 Moderate protein-calorie malnutrition: Secondary | ICD-10-CM | POA: Diagnosis not present

## 2021-07-04 DIAGNOSIS — M4628 Osteomyelitis of vertebra, sacral and sacrococcygeal region: Secondary | ICD-10-CM | POA: Diagnosis not present

## 2021-07-04 DIAGNOSIS — J449 Chronic obstructive pulmonary disease, unspecified: Secondary | ICD-10-CM | POA: Diagnosis not present

## 2021-07-04 DIAGNOSIS — L89154 Pressure ulcer of sacral region, stage 4: Secondary | ICD-10-CM | POA: Diagnosis not present

## 2021-07-04 DIAGNOSIS — K219 Gastro-esophageal reflux disease without esophagitis: Secondary | ICD-10-CM | POA: Diagnosis not present

## 2021-07-04 DIAGNOSIS — Z8701 Personal history of pneumonia (recurrent): Secondary | ICD-10-CM | POA: Diagnosis not present

## 2021-07-04 DIAGNOSIS — E559 Vitamin D deficiency, unspecified: Secondary | ICD-10-CM | POA: Diagnosis not present

## 2021-07-04 DIAGNOSIS — I4891 Unspecified atrial fibrillation: Secondary | ICD-10-CM | POA: Diagnosis not present

## 2021-07-04 DIAGNOSIS — J019 Acute sinusitis, unspecified: Secondary | ICD-10-CM | POA: Diagnosis not present

## 2021-07-04 DIAGNOSIS — F419 Anxiety disorder, unspecified: Secondary | ICD-10-CM | POA: Diagnosis not present

## 2021-07-04 DIAGNOSIS — F32A Depression, unspecified: Secondary | ICD-10-CM | POA: Diagnosis not present

## 2021-07-05 ENCOUNTER — Other Ambulatory Visit: Payer: Self-pay | Admitting: *Deleted

## 2021-07-05 NOTE — Patient Outreach (Signed)
[facility]) Care Management Telephonic RN Care Manager Note   07/05/2021 Name:  Darria Corvera MRN:  [id number] DOB:  [birth year]  Summary: 07/03/21 Post snf [facility] referral from [facility] acute care coordinator staff for complex care screening Call attempt to [phone] as listed as preferred outreach number for patient in EPIC This number reported to not be in service   RN CM outreached to [phone] - Successful outreach  Mrs Weinmann reports she is doing well She confirms she went to see pcp on 06/30/21   Appetite fair ensure bid get full   Need w/c rolator Borrowed a w/c from foster daughter    wt 8 lb -94 lbs She reports she has always been small in stature with a max weight of  141 lbs ht 5'4"   No falls but weak since discharged home Patient with history of fall with nondisplaced condyle fracture of lower end of left femur during September 2022 [facility] hospital stay leading to her stay at [facility]   No sleep issues  She is not aware of the name of the home health agency but reports visits are to start soon  PING indicates she is to receive services from [place] friend visiting so she requested to conclude the call  Recommendations/Changes made from today's visit: Initial assessment  Assessed for worsening symptoms and immediate needs  Malnutrition discussed  [facility] services discussed Concluded call as patient received a visitor  Subjective: Sindia Kowalczyk is an 85 y.o. year old female who is a primary patient of Moon, Amy A, NP. The care management team was consulted for assistance with care management and/or care coordination needs.    Telephonic RN Care Manager completed Telephone Visit today.   Objective:  Medications Reviewed Today     Reviewed by Leatrice Jewels, RMA (Registered Medical Assistant) on 06/06/21 at 1143  Med List Status: <None>   Medication Order Taking? Sig Documenting Provider Last Dose Status  Informant  acetaminophen (TYLENOL) 650 MG CR tablet [id number]  Take 1,300 mg by mouth 2 (two) times daily. [provider]  Active Self  ALPRAZolam (XANAX) 0.25 MG tablet [id number]  Take 0.25 mg by mouth daily as needed for anxiety. [provider]  Active Self           Med Note Marzetta Merino Dec 23, 2019 12:32 PM) 12/23/19: Patient Reports taking daily  CALCIUM CARB-CHOLECALCIFEROL PO [id number]  Take 500 mg by mouth. [provider]  Active   Cyanocobalamin (B-12 COMPLIANCE INJECTION IJ) [id number]  Inject 1 Dose as directed every 30 (thirty) days.  Patient not taking: Reported on 05/03/2021   [provider]  Active Self  denosumab (PROLIA) 60 MG/ML SOSY injection [id number]  Inject 60 mg into the skin every 6 (six) months.  Patient not taking: Reported on 05/03/2021   Laverna Peace A, NP  Active Self  doxycycline (VIBRA-TABS) 100 MG tablet [id number]  Take 1 tablet (100 mg total) by mouth 2 (two) times daily. Tommy Medal, Lavell Islam, MD  Active   ELIQUIS 2.5 MG TABS tablet [id number]  Take 2.5 mg by mouth 2 (two) times daily. [provider]  Active   esomeprazole (NEXIUM) 40 MG capsule [id number]  Take 40 mg by mouth daily.   Patient not taking: Reported on 05/03/2021   [provider]  Active Self  ferrous fumarate (HEMOCYTE - 106 MG FE) 325 (106 Fe) MG TABS  tablet [id number]  Take 1 tablet by mouth 2 (two) times daily. [provider]  Active   mirtazapine (REMERON) 7.5 mg TABS tablet [id number]  Take 7.5 mg by mouth at bedtime. Moon, Amy A, NP  Active Self  Nutritional Supplements (PROTEIN SUPPLEMENT 80% PO) [id number]  Take by mouth. [provider]  Active   OVER THE COUNTER MEDICATION [id number]  Take 2 tablets by mouth daily. Clear lung otc supplement [provider]  Active Self  oxyCODONE (OXY IR/ROXICODONE) 5 MG immediate release tablet [id number]  Take 5 mg by mouth every 6 (six) hours as needed for pain.  [provider]  Active Self  pantoprazole (PROTONIX) 40 MG tablet [id number]  Take 40 mg by mouth daily. [provider]  Active   sennosides-docusate sodium (SENOKOT-S) 8.6-50 MG tablet [id number]  Take 1 tablet by mouth daily. [provider]  Active   traMADol (ULTRAM) 50 MG tablet [id number]  Take 50 mg by mouth every 6 (six) hours as needed. [provider]  Active            Med Note Marzetta Merino Dec 23, 2019 12:31 PM) 12/23/19: Patient reports takes occasionally; at most "twice per day" per patient report  vitamin C (ASCORBIC ACID) 250 MG tablet [id number]  Take 250 mg by mouth daily. [provider]  Active   Vitamin D, Ergocalciferol, (DRISDOL) 1.25 MG (50000 UNIT) CAPS capsule [id number]  Take 50,000 Units by mouth once a week. [provider]  Active             Past Medical History:  Diagnosis Date   Anxiety    situational   Arthritis    Bronchiectasis ([place])    Bronchiectasis ([place]) 05/03/2021   Compression fracture of body of thoracic vertebra (HCC)    Decubitus ulcer of back, stage 3 ([place]) 05/03/2021   Esophageal tear    during hiatal hernia surgery   GERD (gastroesophageal reflux disease)    History of lumbar fusion 05/03/2021   Hx of total hip arthroplasty, right 05/03/2021   Infection caused by Enterobacter cloacae 05/03/2021   Pneumonia    S/p left hip fracture 05/03/2021   Sacral osteomyelitis ([place]) 05/03/2021   Stage IV decubitus ulcer ([place]) 05/03/2021   Wears glasses    Wears partial dentures    Patient Active Problem List   Diagnosis Date Noted   Bronchiectasis ([place]) 05/03/2021   History of lumbar fusion 05/03/2021   Hx of total hip arthroplasty, right 05/03/2021   S/p left hip fracture 05/03/2021   Sacral osteomyelitis ([place]) 05/03/2021   Stage IV decubitus ulcer ([place]) 05/03/2021   Infection caused by Enterobacter cloacae 05/03/2021   Decubitus ulcer of back, stage 3 ([place]) 05/03/2021  '  SDOH:   (Social Determinants of Health) assessments and interventions performed:    Care Plan  Review of patient past medical history, allergies, medications, health status, including review of consultants reports, laboratory and other test data, was performed as part of comprehensive evaluation for care management services.   There are no care plans that you recently modified to display for this patient.    Plan: The patient has been provided with contact information for the care management team and has been advised to call with any health related questions or concerns.  The care management team will reach out to the patient again over the next 30+ business days.  Keenan Dimitrov L. Lavina Hamman, RN, BSN, [place] Telephonic Care Management Care Coordinator  Office number [phone] Main [facility] number [phone] Fax number [phone]

## 2021-07-06 DIAGNOSIS — D649 Anemia, unspecified: Secondary | ICD-10-CM | POA: Diagnosis not present

## 2021-07-06 DIAGNOSIS — M810 Age-related osteoporosis without current pathological fracture: Secondary | ICD-10-CM | POA: Diagnosis not present

## 2021-07-06 DIAGNOSIS — R2689 Other abnormalities of gait and mobility: Secondary | ICD-10-CM | POA: Diagnosis not present

## 2021-07-06 DIAGNOSIS — R2681 Unsteadiness on feet: Secondary | ICD-10-CM | POA: Diagnosis not present

## 2021-07-06 DIAGNOSIS — S0003XD Contusion of scalp, subsequent encounter: Secondary | ICD-10-CM | POA: Diagnosis not present

## 2021-07-06 DIAGNOSIS — E43 Unspecified severe protein-calorie malnutrition: Secondary | ICD-10-CM | POA: Diagnosis not present

## 2021-07-06 DIAGNOSIS — E441 Mild protein-calorie malnutrition: Secondary | ICD-10-CM | POA: Diagnosis not present

## 2021-07-06 DIAGNOSIS — W19XXXD Unspecified fall, subsequent encounter: Secondary | ICD-10-CM | POA: Diagnosis not present

## 2021-07-06 DIAGNOSIS — S72145D Nondisplaced intertrochanteric fracture of left femur, subsequent encounter for closed fracture with routine healing: Secondary | ICD-10-CM | POA: Diagnosis not present

## 2021-07-06 DIAGNOSIS — R5381 Other malaise: Secondary | ICD-10-CM | POA: Diagnosis not present

## 2021-07-06 DIAGNOSIS — I4891 Unspecified atrial fibrillation: Secondary | ICD-10-CM | POA: Diagnosis not present

## 2021-07-06 DIAGNOSIS — L89154 Pressure ulcer of sacral region, stage 4: Secondary | ICD-10-CM | POA: Diagnosis not present

## 2021-07-06 DIAGNOSIS — K219 Gastro-esophageal reflux disease without esophagitis: Secondary | ICD-10-CM | POA: Diagnosis not present

## 2021-07-06 DIAGNOSIS — F419 Anxiety disorder, unspecified: Secondary | ICD-10-CM | POA: Diagnosis not present

## 2021-07-06 DIAGNOSIS — I480 Paroxysmal atrial fibrillation: Secondary | ICD-10-CM | POA: Diagnosis not present

## 2021-07-06 DIAGNOSIS — M6281 Muscle weakness (generalized): Secondary | ICD-10-CM | POA: Diagnosis not present

## 2021-07-10 ENCOUNTER — Encounter: Payer: Self-pay | Admitting: Infectious Disease

## 2021-07-10 ENCOUNTER — Other Ambulatory Visit: Payer: Self-pay

## 2021-07-10 ENCOUNTER — Ambulatory Visit (INDEPENDENT_AMBULATORY_CARE_PROVIDER_SITE_OTHER): Payer: Medicare Other | Admitting: Infectious Disease

## 2021-07-10 VITALS — BP 96/61 | HR 120 | Temp 97.0°F

## 2021-07-10 DIAGNOSIS — L8944 Pressure ulcer of contiguous site of back, buttock and hip, stage 4: Secondary | ICD-10-CM | POA: Diagnosis not present

## 2021-07-10 DIAGNOSIS — L89103 Pressure ulcer of unspecified part of back, stage 3: Secondary | ICD-10-CM

## 2021-07-10 DIAGNOSIS — I9589 Other hypotension: Secondary | ICD-10-CM

## 2021-07-10 DIAGNOSIS — A498 Other bacterial infections of unspecified site: Secondary | ICD-10-CM | POA: Diagnosis not present

## 2021-07-10 DIAGNOSIS — Z8781 Personal history of (healed) traumatic fracture: Secondary | ICD-10-CM

## 2021-07-10 DIAGNOSIS — E861 Hypovolemia: Secondary | ICD-10-CM | POA: Diagnosis not present

## 2021-07-10 DIAGNOSIS — J479 Bronchiectasis, uncomplicated: Secondary | ICD-10-CM

## 2021-07-10 DIAGNOSIS — M4628 Osteomyelitis of vertebra, sacral and sacrococcygeal region: Secondary | ICD-10-CM | POA: Diagnosis not present

## 2021-07-10 DIAGNOSIS — R000 Tachycardia, unspecified: Secondary | ICD-10-CM

## 2021-07-10 DIAGNOSIS — I959 Hypotension, unspecified: Secondary | ICD-10-CM

## 2021-07-10 HISTORY — DX: Hypotension, unspecified: I95.9

## 2021-07-10 HISTORY — DX: Tachycardia, unspecified: R00.0

## 2021-07-10 NOTE — Progress Notes (Signed)
Subjective:   Chief complaint : Fatigue and recent low blood pressure and tachycardia that was worked up in the ER also for follow-up for stage IV decubitus ulcer and osteomyelitis  Patient ID: Jasmin Anderson, female    DOB: [birth year], 85 y.o.   MRN: [id number]  HPI  Jasmin Anderson is an 85 year old Caucasian lady with multiple medical problems [facility].  She has had unfortunate time recently due to her osteoporosis having had a compression fracture of her thoracic spine that is post T7-8 T9 kyphoplasty in February 2020.  He is subsequently sustained a fall and fractured her right hip which initially was treated with ORIF but this apparently failed and she ultimately ended up having total hip arthroplasty on the right side.  This September 2022 she fell and fractured her left hip which required placement of pins.  She had been residing at [facility].  She has developed a stage IV decubitus ulcer in her sacral region due to the fact that she sits for prolonged periods of time.  She is able to get up and walk some though less so with her hip fractures.  She has a specialized air mattress at the skilled nursing facility but unfortunately she cannot lie in the bed well due to her bronchiectasis.  She has been getting severely dyspneic if she lies down and begins coughing.  Therefore she spent the majority of her time sitting.  She had a wound culture taken from the decubitus ulcer which yielded Enterococcus cloacae which was resistant to cefazolin but otherwise sensitive.  She has been started on doxycycline and ciprofloxacin.  She was referred to our clinic for management of sacral osteomyelitis.  I saw her in clinic and we started on IV ertapenem along with doxycycline to cover both the Enterobacter anaerobes and potentially staph epi or MRSA though I doubt MRSA is a culprit if it was not isolated on bone culture.  She still however is sitting in the area  which course is making it difficult in terms of applying pressure which was the cause of this decubitus ulcer. he  told me wound is improving per the staff at the skilled nursing facility   When I last saw her she has further decubitus ulcers along her vertebra that were worsening in which had me concerned that they might eventually become stage IV   She had completed her doxycycline and ertapenem on November 17.  PICC line has been pulled.  She was [facility] to [facility] but now is with her daughter over the last couple of days.   She came to clinic accompanied by her daughter.  She had a low blood pressure in clinic in the 70s over 50s with a heart rate in the 130s originally.  Which her daughter said she had recently as well and been been to the ER and been given fluids and had various labs done.  I cannot find record of that ER visit in our electronic medical record or in Care Everywhere  We rechecked her blood pressure at the end of the visit her systolic and risen into the 90s.  She did not want to have further work-up in our clinic but wanted to go home.   Past Medical History:  Diagnosis Date   Anxiety    situational   Arthritis    Bronchiectasis ([place])    Bronchiectasis ([place]) 05/03/2021   Compression fracture of body of thoracic vertebra (HCC)  Decubitus ulcer of back, stage 3 ([place]) 05/03/2021   Esophageal tear    during hiatal hernia surgery   GERD (gastroesophageal reflux disease)    History of lumbar fusion 05/03/2021   Hx of total hip arthroplasty, right 05/03/2021   Infection caused by Enterobacter cloacae 05/03/2021   Pneumonia    S/p left hip fracture 05/03/2021   Sacral osteomyelitis ([place]) 05/03/2021   Stage IV decubitus ulcer ([place]) 05/03/2021   Wears glasses    Wears partial dentures     Past Surgical History:  Procedure Laterality Date   APPENDECTOMY     CATARACT EXTRACTION W/ INTRAOCULAR LENS  IMPLANT, BILATERAL     CESAREAN SECTION     x  2   CHOLECYSTECTOMY     COLONOSCOPY     EYE SURGERY     FOOT NEUROMA SURGERY     x 2   FRACTURE SURGERY     HERNIA REPAIR     hiatal hernia   KYPHOPLASTY Bilateral 09/09/2018   Procedure: Kyphoplasty - T7 - T8 - T9;  Surgeon: Consuella Lose, MD;  Location: [facility];  Service: Neurosurgery;  Laterality: Bilateral;   MULTIPLE TOOTH EXTRACTIONS     TUBAL LIGATION      No family history on file.    Social History   Socioeconomic History   Marital status: Widowed    Spouse name: Not on file   Number of children: Not on file   Years of education: 12   Highest education level: Some college, no degree  Occupational History   Not on file  Tobacco Use   Smoking status: Never   Smokeless tobacco: Never  Vaping Use   Vaping Use: Never used  Substance and Sexual Activity   Alcohol use: Not Currently    Comment: rare   Drug use: Never   Sexual activity: Not on file  Other Topics Concern   Not on file  Social History Narrative   Not on file   Social Determinants of Health   Financial Resource Strain: Not on file  Food Insecurity: Not on file  Transportation Needs: Not on file  Physical Activity: Not on file  Stress: Not on file  Social Connections: Not on file    Allergies  Allergen Reactions   Codeine Palpitations   Gabapentin Swelling    Foot swelling     Current Outpatient Medications:    acetaminophen (TYLENOL) 650 MG CR tablet, Take 1,300 mg by mouth 2 (two) times daily., Disp: , Rfl:    ALPRAZolam (XANAX) 0.25 MG tablet, Take 0.25 mg by mouth daily as needed for anxiety., Disp: , Rfl:    CALCIUM CARB-CHOLECALCIFEROL PO, Take 500 mg by mouth., Disp: , Rfl:    Cyanocobalamin (B-12 COMPLIANCE INJECTION IJ), Inject 1 Dose as directed every 30 (thirty) days., Disp: , Rfl:    denosumab (PROLIA) 60 MG/ML SOSY injection, Inject 60 mg into the skin every 6 (six) months., Disp: , Rfl:    ELIQUIS 2.5 MG TABS tablet, Take 2.5 mg by mouth 2 (two) times daily., Disp: ,  Rfl:    esomeprazole (NEXIUM) 40 MG capsule, Take 40 mg by mouth daily., Disp: , Rfl:    ferrous fumarate (HEMOCYTE - 106 MG FE) 325 (106 Fe) MG TABS tablet, Take 1 tablet by mouth 2 (two) times daily., Disp: , Rfl:    mirtazapine (REMERON) 7.5 mg TABS tablet, Take 7.5 mg by mouth at bedtime., Disp: , Rfl:    Nutritional Supplements (PROTEIN SUPPLEMENT 80%  PO), Take by mouth., Disp: , Rfl:    OVER THE COUNTER MEDICATION, Take 2 tablets by mouth daily. Clear lung otc supplement, Disp: , Rfl:    oxyCODONE (OXY IR/ROXICODONE) 5 MG immediate release tablet, Take 5 mg by mouth every 6 (six) hours as needed for pain., Disp: , Rfl:    pantoprazole (PROTONIX) 40 MG tablet, Take 40 mg by mouth daily., Disp: , Rfl:    sennosides-docusate sodium (SENOKOT-S) 8.6-50 MG tablet, Take 1 tablet by mouth daily., Disp: , Rfl:    traMADol (ULTRAM) 50 MG tablet, Take 50 mg by mouth every 6 (six) hours as needed., Disp: , Rfl:    vitamin C (ASCORBIC ACID) 250 MG tablet, Take 250 mg by mouth daily., Disp: , Rfl:    Vitamin D, Ergocalciferol, (DRISDOL) 1.25 MG (50000 UNIT) CAPS capsule, Take 50,000 Units by mouth once a week., Disp: , Rfl:    doxycycline (VIBRAMYCIN) 100 MG capsule, Take 100 mg by mouth 2 (two) times daily., Disp: , Rfl:    Review of Systems  Constitutional:  Positive for fatigue. Negative for activity change, appetite change, chills, diaphoresis, fever and unexpected weight change.  HENT:  Negative for congestion, rhinorrhea, sinus pressure, sneezing, sore throat and trouble swallowing.   Eyes:  Negative for photophobia and visual disturbance.  Respiratory:  Positive for cough. Negative for chest tightness, shortness of breath, wheezing and stridor.   Cardiovascular:  Negative for chest pain, palpitations and leg swelling.  Gastrointestinal:  Negative for abdominal distention, abdominal pain, anal bleeding, blood in stool, constipation, diarrhea, nausea and vomiting.  Genitourinary:  Negative for  difficulty urinating, dysuria, flank pain and hematuria.  Musculoskeletal:  Negative for arthralgias, back pain, gait problem, joint swelling and myalgias.  Skin:  Positive for wound. Negative for color change, pallor and rash.  Neurological:  Negative for dizziness, tremors, weakness and light-headedness.  Hematological:  Negative for adenopathy. Does not bruise/bleed easily.  Psychiatric/Behavioral:  Negative for agitation, behavioral problems, confusion, decreased concentration, dysphoric mood and sleep disturbance.       Objective:   Physical Exam Vitals reviewed.  Constitutional:      General: She is not in acute distress.    Appearance: She is underweight. She is not diaphoretic.  HENT:     Head: Normocephalic and atraumatic.     Right Ear: External ear normal.     Left Ear: External ear normal.     Nose: Nose normal.     Mouth/Throat:     Pharynx: No oropharyngeal exudate.  Eyes:     General: No scleral icterus.       Right eye: No discharge.        Left eye: No discharge.     Extraocular Movements: Extraocular movements intact.     Conjunctiva/sclera: Conjunctivae normal.  Cardiovascular:     Rate and Rhythm: Normal rate and regular rhythm.     Heart sounds:    No friction rub. No gallop.  Pulmonary:     Effort: Pulmonary effort is normal. No respiratory distress.     Breath sounds: No stridor. No wheezing, rhonchi or rales.  Abdominal:     General: There is no distension.     Palpations: Abdomen is soft.     Tenderness: There is no rebound.  Musculoskeletal:        General: Normal range of motion.     Cervical back: Normal range of motion and neck supple.  Lymphadenopathy:     Cervical: No cervical adenopathy.  Skin:    General: Skin is warm and dry.     Coloration: Skin is not pale.     Findings: No erythema or rash.  Neurological:     Mental Status: She is alert and oriented to person, place, and time.     Coordination: Coordination normal.  Psychiatric:         Mood and Affect: Mood normal.        Behavior: Behavior normal.        Thought Content: Thought content normal.        Judgment: Judgment normal.   Severe kyphosis:  Upper area of stage III decubitus ulcers around vertebra 05/03/2021:    Similar area tNovember 8, 2022:      07/10/2021:      Sacral area 05/03/2021:     06/06/2021:     07/10/2021:          Assessment & Plan:  Stage IV decubitus ulcer with sacral osteomyelitis:  She is received parenteral therapy with ertapenem along with doxycycline  I will check a sed rate C-reactive protein comprehensive metabolic panel CBC with differential today  Continue efforts at offloading the site.  Stage III decubitus ulcers in the thoracic area these have improved now no longer deep  Low blood pressure: Blood pressure improved after we checked it again in clinic.  She likely has a chronic low blood pressure in the context of her loss of muscle mass and cachexia.  Vitals:   07/10/21 1047 07/10/21 1103  BP: (!) 78/60 96/61  Pulse: (!) 133 (!) 120  Temp: (!) 97 F (36.1 C)      Again she did not want further work-up here and we could not give her fluids.  She wanted to go home which I think is certainly a decision that she is capable of making an I have encouraged her to push fluids at home.  Bronchiectasis: With a chronic cough though with her daughters coaching she has been able to lie down and offload her buttocks a little bit   DVT on Eliquis   On blood thinner Eliquis this should mitigate risk of DVT associated with PICC after it is placed  Bronchiectasis:         \

## 2021-07-11 LAB — CBC WITH DIFFERENTIAL/PLATELET
Absolute Monocytes: 725 cells/uL (ref 200–950)
Basophils Absolute: 35 cells/uL (ref 0–200)
Basophils Relative: 0.3 %
Eosinophils Absolute: 81 cells/uL (ref 15–500)
Eosinophils Relative: 0.7 %
HCT: 28.7 % — ABNORMAL LOW (ref 35.0–45.0)
Hemoglobin: 9.2 g/dL — ABNORMAL LOW (ref 11.7–15.5)
Lymphs Abs: 1541 cells/uL (ref 850–3900)
MCH: 28.8 pg (ref 27.0–33.0)
MCHC: 32.1 g/dL (ref 32.0–36.0)
MCV: 89.7 fL (ref 80.0–100.0)
MPV: 8.7 fL (ref 7.5–12.5)
Monocytes Relative: 6.3 %
Neutro Abs: 9120 cells/uL — ABNORMAL HIGH (ref 1500–7800)
Neutrophils Relative %: 79.3 %
Platelets: 768 10*3/uL — ABNORMAL HIGH (ref 140–400)
RBC: 3.2 10*6/uL — ABNORMAL LOW (ref 3.80–5.10)
RDW: 13.7 % (ref 11.0–15.0)
Total Lymphocyte: 13.4 %
WBC: 11.5 10*3/uL — ABNORMAL HIGH (ref 3.8–10.8)

## 2021-07-11 LAB — COMPLETE METABOLIC PANEL WITH GFR
AG Ratio: 1.1 (calc) (ref 1.0–2.5)
ALT: 5 U/L — ABNORMAL LOW (ref 6–29)
AST: 13 U/L (ref 10–35)
Albumin: 2.9 g/dL — ABNORMAL LOW (ref 3.6–5.1)
Alkaline phosphatase (APISO): 167 U/L — ABNORMAL HIGH (ref 37–153)
BUN/Creatinine Ratio: 43 (calc) — ABNORMAL HIGH (ref 6–22)
BUN: 23 mg/dL (ref 7–25)
CO2: 32 mmol/L (ref 20–32)
Calcium: 9.1 mg/dL (ref 8.6–10.4)
Chloride: 93 mmol/L — ABNORMAL LOW (ref 98–110)
Creat: 0.53 mg/dL — ABNORMAL LOW (ref 0.60–0.95)
Globulin: 2.6 g/dL (calc) (ref 1.9–3.7)
Glucose, Bld: 98 mg/dL (ref 65–99)
Potassium: 4.8 mmol/L (ref 3.5–5.3)
Sodium: 135 mmol/L (ref 135–146)
Total Bilirubin: 0.4 mg/dL (ref 0.2–1.2)
Total Protein: 5.5 g/dL — ABNORMAL LOW (ref 6.1–8.1)
eGFR: 92 mL/min/{1.73_m2} (ref >=60)

## 2021-07-11 LAB — C-REACTIVE PROTEIN: CRP: 39.8 mg/L — ABNORMAL HIGH (ref <8.0)

## 2021-07-11 LAB — SEDIMENTATION RATE: Sed Rate: 28 mm/h (ref 0–30)

## 2021-07-12 DIAGNOSIS — L89154 Pressure ulcer of sacral region, stage 4: Secondary | ICD-10-CM | POA: Diagnosis not present

## 2021-07-12 DIAGNOSIS — J449 Chronic obstructive pulmonary disease, unspecified: Secondary | ICD-10-CM | POA: Diagnosis not present

## 2021-07-12 DIAGNOSIS — D539 Nutritional anemia, unspecified: Secondary | ICD-10-CM | POA: Diagnosis not present

## 2021-07-12 DIAGNOSIS — M80052D Age-related osteoporosis with current pathological fracture, left femur, subsequent encounter for fracture with routine healing: Secondary | ICD-10-CM | POA: Diagnosis not present

## 2021-07-12 DIAGNOSIS — G8929 Other chronic pain: Secondary | ICD-10-CM | POA: Diagnosis not present

## 2021-07-12 DIAGNOSIS — E440 Moderate protein-calorie malnutrition: Secondary | ICD-10-CM | POA: Diagnosis not present

## 2021-07-13 DIAGNOSIS — L89154 Pressure ulcer of sacral region, stage 4: Secondary | ICD-10-CM | POA: Diagnosis not present

## 2021-07-13 DIAGNOSIS — M80052D Age-related osteoporosis with current pathological fracture, left femur, subsequent encounter for fracture with routine healing: Secondary | ICD-10-CM | POA: Diagnosis not present

## 2021-07-13 DIAGNOSIS — J449 Chronic obstructive pulmonary disease, unspecified: Secondary | ICD-10-CM | POA: Diagnosis not present

## 2021-07-13 DIAGNOSIS — D539 Nutritional anemia, unspecified: Secondary | ICD-10-CM | POA: Diagnosis not present

## 2021-07-13 DIAGNOSIS — G8929 Other chronic pain: Secondary | ICD-10-CM | POA: Diagnosis not present

## 2021-07-13 DIAGNOSIS — E440 Moderate protein-calorie malnutrition: Secondary | ICD-10-CM | POA: Diagnosis not present

## 2021-07-15 DIAGNOSIS — M4628 Osteomyelitis of vertebra, sacral and sacrococcygeal region: Secondary | ICD-10-CM | POA: Diagnosis not present

## 2021-07-15 DIAGNOSIS — R102 Pelvic and perineal pain: Secondary | ICD-10-CM | POA: Diagnosis not present

## 2021-07-15 DIAGNOSIS — M25551 Pain in right hip: Secondary | ICD-10-CM | POA: Diagnosis not present

## 2021-07-15 DIAGNOSIS — K449 Diaphragmatic hernia without obstruction or gangrene: Secondary | ICD-10-CM | POA: Diagnosis not present

## 2021-07-15 DIAGNOSIS — I82431 Acute embolism and thrombosis of right popliteal vein: Secondary | ICD-10-CM | POA: Diagnosis not present

## 2021-07-15 DIAGNOSIS — D509 Iron deficiency anemia, unspecified: Secondary | ICD-10-CM | POA: Diagnosis not present

## 2021-07-15 DIAGNOSIS — I82411 Acute embolism and thrombosis of right femoral vein: Secondary | ICD-10-CM | POA: Diagnosis not present

## 2021-07-15 DIAGNOSIS — Z79899 Other long term (current) drug therapy: Secondary | ICD-10-CM | POA: Diagnosis not present

## 2021-07-15 DIAGNOSIS — J479 Bronchiectasis, uncomplicated: Secondary | ICD-10-CM | POA: Diagnosis not present

## 2021-07-15 DIAGNOSIS — Z7901 Long term (current) use of anticoagulants: Secondary | ICD-10-CM | POA: Diagnosis not present

## 2021-07-15 DIAGNOSIS — I82441 Acute embolism and thrombosis of right tibial vein: Secondary | ICD-10-CM | POA: Diagnosis not present

## 2021-07-15 DIAGNOSIS — E871 Hypo-osmolality and hyponatremia: Secondary | ICD-10-CM | POA: Diagnosis not present

## 2021-07-15 DIAGNOSIS — I361 Nonrheumatic tricuspid (valve) insufficiency: Secondary | ICD-10-CM | POA: Diagnosis not present

## 2021-07-15 DIAGNOSIS — E43 Unspecified severe protein-calorie malnutrition: Secondary | ICD-10-CM | POA: Diagnosis not present

## 2021-07-15 DIAGNOSIS — R262 Difficulty in walking, not elsewhere classified: Secondary | ICD-10-CM | POA: Diagnosis not present

## 2021-07-15 DIAGNOSIS — I2699 Other pulmonary embolism without acute cor pulmonale: Secondary | ICD-10-CM | POA: Diagnosis not present

## 2021-07-15 DIAGNOSIS — I82401 Acute embolism and thrombosis of unspecified deep veins of right lower extremity: Secondary | ICD-10-CM | POA: Diagnosis not present

## 2021-07-17 DIAGNOSIS — D539 Nutritional anemia, unspecified: Secondary | ICD-10-CM | POA: Diagnosis not present

## 2021-07-17 DIAGNOSIS — G8929 Other chronic pain: Secondary | ICD-10-CM | POA: Diagnosis not present

## 2021-07-17 DIAGNOSIS — M80052D Age-related osteoporosis with current pathological fracture, left femur, subsequent encounter for fracture with routine healing: Secondary | ICD-10-CM | POA: Diagnosis not present

## 2021-07-17 DIAGNOSIS — E440 Moderate protein-calorie malnutrition: Secondary | ICD-10-CM | POA: Diagnosis not present

## 2021-07-17 DIAGNOSIS — L89154 Pressure ulcer of sacral region, stage 4: Secondary | ICD-10-CM | POA: Diagnosis not present

## 2021-07-17 DIAGNOSIS — J449 Chronic obstructive pulmonary disease, unspecified: Secondary | ICD-10-CM | POA: Diagnosis not present

## 2021-07-19 DIAGNOSIS — G8929 Other chronic pain: Secondary | ICD-10-CM | POA: Diagnosis not present

## 2021-07-19 DIAGNOSIS — J449 Chronic obstructive pulmonary disease, unspecified: Secondary | ICD-10-CM | POA: Diagnosis not present

## 2021-07-19 DIAGNOSIS — M80052D Age-related osteoporosis with current pathological fracture, left femur, subsequent encounter for fracture with routine healing: Secondary | ICD-10-CM | POA: Diagnosis not present

## 2021-07-19 DIAGNOSIS — D539 Nutritional anemia, unspecified: Secondary | ICD-10-CM | POA: Diagnosis not present

## 2021-07-19 DIAGNOSIS — L89154 Pressure ulcer of sacral region, stage 4: Secondary | ICD-10-CM | POA: Diagnosis not present

## 2021-07-19 DIAGNOSIS — E440 Moderate protein-calorie malnutrition: Secondary | ICD-10-CM | POA: Diagnosis not present

## 2021-07-21 ENCOUNTER — Other Ambulatory Visit: Payer: Self-pay | Admitting: *Deleted

## 2021-07-21 DIAGNOSIS — J449 Chronic obstructive pulmonary disease, unspecified: Secondary | ICD-10-CM | POA: Diagnosis not present

## 2021-07-21 DIAGNOSIS — L89154 Pressure ulcer of sacral region, stage 4: Secondary | ICD-10-CM | POA: Diagnosis not present

## 2021-07-21 DIAGNOSIS — E440 Moderate protein-calorie malnutrition: Secondary | ICD-10-CM | POA: Diagnosis not present

## 2021-07-21 DIAGNOSIS — G8929 Other chronic pain: Secondary | ICD-10-CM | POA: Diagnosis not present

## 2021-07-21 DIAGNOSIS — D539 Nutritional anemia, unspecified: Secondary | ICD-10-CM | POA: Diagnosis not present

## 2021-07-21 DIAGNOSIS — M80052D Age-related osteoporosis with current pathological fracture, left femur, subsequent encounter for fracture with routine healing: Secondary | ICD-10-CM | POA: Diagnosis not present

## 2021-07-21 NOTE — Patient Outreach (Addendum)
[facility]) Care Management Telephonic RN Care Manager Note   09/08/2021 Name:  Jasmin Anderson MRN:  [id number] DOB:  June 07, 1936  Summary: Further follow up with patient after her snf discharge home plus EMMI general 07/12/21 referral for follow up appointment scheduling concerns from her Tuesday 07/11/21 EMMI call  Patient reports she continues to do well  She reports she continue to work with home health therapy and was seen today She has discussed some of her right hip pain with the therapist  She reports she follows back up with her pcp after christmas EMMI general resolved  She denies care coordination needs on this call  She denies falls since discharged home but her appetite remains fair. She continues with intake of Ensure bid   Recommendations/Changes made from today's visit: Commended on her home management and therapy attendance Encouragement provided [facility] progression discussed and she agrees to follow up outreach   Subjective: Jasmin Anderson is an 85 y.o. year old female who is a primary patient of Moon, Amy A, NP. The care management team was consulted for assistance with care management and/or care coordination needs.    Telephonic RN Care Manager completed Telephone Visit today.   Objective:  Medications Reviewed Today     Reviewed by Allegra Grana, CMA (Certified Medical Assistant) on 07/10/21 at 1049  Med List Status: <None>   Medication Order Taking? Sig Documenting Provider Last Dose Status Informant  acetaminophen (TYLENOL) 650 MG CR tablet [id number] Yes Take 1,300 mg by mouth 2 (two) times daily. [provider] Taking Active Self  ALPRAZolam (XANAX) 0.25 MG tablet [id number] Yes Take 0.25 mg by mouth daily as needed for anxiety. [provider] Taking Active Self           Med Note Marzetta Merino Dec 23, 2019 12:32 PM) 12/23/19: Patient Reports taking daily  CALCIUM CARB-CHOLECALCIFEROL PO [id number] Yes Take 500 mg by  mouth. [provider] Taking Active   Cyanocobalamin (B-12 COMPLIANCE INJECTION IJ) [id number] Yes Inject 1 Dose as directed every 30 (thirty) days. [provider] Taking Active   denosumab (PROLIA) 60 MG/ML SOSY injection [id number] Yes Inject 60 mg into the skin every 6 (six) months. Moon, Amy A, NP Taking Active   doxycycline (VIBRAMYCIN) 100 MG capsule [id number]  Take 100 mg by mouth 2 (two) times daily. [provider]  Active   ELIQUIS 2.5 MG TABS tablet [id number] Yes Take 2.5 mg by mouth 2 (two) times daily. [provider] Taking Active   esomeprazole (NEXIUM) 40 MG capsule [id number] Yes Take 40 mg by mouth daily. [provider] Taking Active   ferrous fumarate (HEMOCYTE - 106 MG FE) 325 (106 Fe) MG TABS tablet [id number] Yes Take 1 tablet by mouth 2 (two) times daily. [provider] Taking Active   mirtazapine (REMERON) 7.5 mg TABS tablet [id number] Yes Take 7.5 mg by mouth at bedtime. Moon, Amy A, NP Taking Active Self  Nutritional Supplements (PROTEIN SUPPLEMENT 80% PO) [id number] Yes Take by mouth. [provider] Taking Active   OVER THE [place] [id number] Yes Take 2 tablets by mouth daily. Clear lung otc supplement [provider] Taking Active Self  oxyCODONE (OXY IR/ROXICODONE) 5 MG immediate release tablet [id number] Yes Take 5 mg by mouth every 6 (six) hours as needed for pain. [provider] Taking Active Self  pantoprazole (PROTONIX) 40 MG tablet [id number] Yes Take 40 mg by mouth daily. [provider] Taking Active  sennosides-docusate sodium (SENOKOT-S) 8.6-50 MG tablet [id number] Yes Take 1 tablet by mouth daily. [provider] Taking Active   traMADol (ULTRAM) 50 MG tablet [id number] Yes Take 50 mg by mouth every 6 (six) hours as needed. [provider] Taking Active            Med Note Marzetta Merino Dec 23, 2019 12:31 PM) 12/23/19: Patient reports takes  occasionally; at most "twice per day" per patient report  vitamin C (ASCORBIC ACID) 250 MG tablet [id number] Yes Take 250 mg by mouth daily. [provider] Taking Active   Vitamin D, Ergocalciferol, (DRISDOL) 1.25 MG (50000 UNIT) CAPS capsule [id number] Yes Take 50,000 Units by mouth once a week. [provider] Taking Active            Patient Active Problem List   Diagnosis Date Noted   Hypotension 07/10/2021   Tachycardia 07/10/2021   Bronchiectasis ([place]) 05/03/2021   History of lumbar fusion 05/03/2021   Hx of total hip arthroplasty, right 05/03/2021   S/p left hip fracture 05/03/2021   Sacral osteomyelitis ([place]) 05/03/2021   Stage IV decubitus ulcer ([place]) 05/03/2021   Infection caused by Enterobacter cloacae 05/03/2021   Decubitus ulcer of back, stage 3 ([place]) 05/03/2021   Past Medical History:  Diagnosis Date   Anxiety    situational   Arthritis    Bronchiectasis ([place])    Bronchiectasis ([place]) 05/03/2021   Compression fracture of body of thoracic vertebra ([place])    Decubitus ulcer of back, stage 3 ([place]) 05/03/2021   Esophageal tear    during hiatal hernia surgery   GERD (gastroesophageal reflux disease)    History of lumbar fusion 05/03/2021   Hx of total hip arthroplasty, right 05/03/2021   Hypotension 07/10/2021   Infection caused by Enterobacter cloacae 05/03/2021   Pneumonia    S/p left hip fracture 05/03/2021   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   Stage IV decubitus ulcer ([place]) 05/03/2021   Tachycardia 07/10/2021   Wears glasses    Wears partial dentures      SDOH:  (Social Determinants of Health) assessments and interventions performed:  SDOH Interventions    Flowsheet Row Most Recent Value  SDOH Interventions   Food Insecurity Interventions Intervention Not Indicated  Financial Strain Interventions Intervention Not Indicated  Housing Interventions Intervention Not Indicated  Intimate Partner Violence Interventions Intervention Not  Indicated  Stress Interventions Intervention Not Indicated  Social Connections Interventions Intervention Not Indicated  Transportation Interventions Intervention Not Indicated       Care Plan  Review of patient past medical history, allergies, medications, health status, including review of consultants reports, laboratory and other test data, was performed as part of comprehensive evaluation for care management services.   Care Plan : RN Care Manager Plan of Care  Updates made by Barbaraann Faster, RN since 09/08/2021 12:00 AM     Problem: Complex Care Coordination Needs and disease management in patient with fractures   Priority: High     Long-Range Goal: Establish Plan of Care for Management Complex SDOH Barriers, disease management and Care Coordination Needs in patient with fractures   This Visit's Progress: On track  Priority: High  Note:   Current Barriers:  Knowledge Deficits related to plan of care for management of fractures  Care Coordination needs related to Limited education about home care of fractures*  RNCM Clinical Goal(s):  Patient will verbalize understanding of plan for management of fractures as evidenced by fall prevention  through collaboration with Consulting civil engineer, provider, and care team.   Interventions: Outreaches for care coordination, disease management, resources, home care/education needs Inter-disciplinary care team collaboration (see longitudinal plan of care) Evaluation of current treatment plan related to  self management and patient's adherence to plan as established by provider   Falls Interventions:  (Status:  Goal on track:  Yes.) Short Term Goal Advised patient of importance of notifying provider of falls Assessed for falls since last encounter  Patient Goals/Self-Care Activities: Take all medications as prescribed Attend all scheduled provider appointments Call provider office for new concerns or questions   Follow Up Plan:  The  patient has been provided with contact information for the care management team and has been advised to call with any health related questions or concerns.  The care management team will reach out to the patient again over the next 60+ business days.       Plan: The patient has been provided with contact information for the care management team and has been advised to call with any health related questions or concerns.  The care management team will reach out to the patient again over the next 60+ business days.  Brayen Bunn L. Lavina Hamman, RN, BSN, [place] Coordinator Office number [phone] [facility] number [phone] Fax number [phone]

## 2021-07-24 DIAGNOSIS — D62 Acute posthemorrhagic anemia: Secondary | ICD-10-CM | POA: Diagnosis not present

## 2021-07-24 DIAGNOSIS — D509 Iron deficiency anemia, unspecified: Secondary | ICD-10-CM | POA: Diagnosis not present

## 2021-07-24 DIAGNOSIS — J9610 Chronic respiratory failure, unspecified whether with hypoxia or hypercapnia: Secondary | ICD-10-CM | POA: Diagnosis not present

## 2021-07-24 DIAGNOSIS — Z7901 Long term (current) use of anticoagulants: Secondary | ICD-10-CM | POA: Diagnosis not present

## 2021-07-24 DIAGNOSIS — M80052D Age-related osteoporosis with current pathological fracture, left femur, subsequent encounter for fracture with routine healing: Secondary | ICD-10-CM | POA: Diagnosis not present

## 2021-07-24 DIAGNOSIS — Z86711 Personal history of pulmonary embolism: Secondary | ICD-10-CM | POA: Diagnosis not present

## 2021-07-24 DIAGNOSIS — Z20822 Contact with and (suspected) exposure to covid-19: Secondary | ICD-10-CM | POA: Diagnosis not present

## 2021-07-24 DIAGNOSIS — Z9981 Dependence on supplemental oxygen: Secondary | ICD-10-CM | POA: Diagnosis not present

## 2021-07-24 DIAGNOSIS — R000 Tachycardia, unspecified: Secondary | ICD-10-CM | POA: Diagnosis not present

## 2021-07-24 DIAGNOSIS — K259 Gastric ulcer, unspecified as acute or chronic, without hemorrhage or perforation: Secondary | ICD-10-CM | POA: Diagnosis not present

## 2021-07-24 DIAGNOSIS — I4891 Unspecified atrial fibrillation: Secondary | ICD-10-CM | POA: Diagnosis not present

## 2021-07-24 DIAGNOSIS — E43 Unspecified severe protein-calorie malnutrition: Secondary | ICD-10-CM | POA: Diagnosis not present

## 2021-07-24 DIAGNOSIS — Z66 Do not resuscitate: Secondary | ICD-10-CM | POA: Diagnosis not present

## 2021-07-24 DIAGNOSIS — I959 Hypotension, unspecified: Secondary | ICD-10-CM | POA: Diagnosis not present

## 2021-07-24 DIAGNOSIS — Z885 Allergy status to narcotic agent status: Secondary | ICD-10-CM | POA: Diagnosis not present

## 2021-07-24 DIAGNOSIS — D539 Nutritional anemia, unspecified: Secondary | ICD-10-CM | POA: Diagnosis not present

## 2021-07-24 DIAGNOSIS — R609 Edema, unspecified: Secondary | ICD-10-CM | POA: Diagnosis not present

## 2021-07-24 DIAGNOSIS — K440 Diaphragmatic hernia with obstruction, without gangrene: Secondary | ICD-10-CM | POA: Diagnosis not present

## 2021-07-24 DIAGNOSIS — K5521 Angiodysplasia of colon with hemorrhage: Secondary | ICD-10-CM | POA: Diagnosis not present

## 2021-07-24 DIAGNOSIS — K2100 Gastro-esophageal reflux disease with esophagitis, without bleeding: Secondary | ICD-10-CM | POA: Diagnosis not present

## 2021-07-24 DIAGNOSIS — I2699 Other pulmonary embolism without acute cor pulmonale: Secondary | ICD-10-CM | POA: Diagnosis not present

## 2021-07-24 DIAGNOSIS — K219 Gastro-esophageal reflux disease without esophagitis: Secondary | ICD-10-CM | POA: Diagnosis not present

## 2021-07-24 DIAGNOSIS — F419 Anxiety disorder, unspecified: Secondary | ICD-10-CM | POA: Diagnosis not present

## 2021-07-24 DIAGNOSIS — L89154 Pressure ulcer of sacral region, stage 4: Secondary | ICD-10-CM | POA: Diagnosis not present

## 2021-07-24 DIAGNOSIS — K208 Other esophagitis without bleeding: Secondary | ICD-10-CM | POA: Diagnosis not present

## 2021-07-24 DIAGNOSIS — R531 Weakness: Secondary | ICD-10-CM | POA: Diagnosis not present

## 2021-07-24 DIAGNOSIS — I82401 Acute embolism and thrombosis of unspecified deep veins of right lower extremity: Secondary | ICD-10-CM | POA: Diagnosis not present

## 2021-07-24 DIAGNOSIS — R0902 Hypoxemia: Secondary | ICD-10-CM | POA: Diagnosis not present

## 2021-07-24 DIAGNOSIS — Z79899 Other long term (current) drug therapy: Secondary | ICD-10-CM | POA: Diagnosis not present

## 2021-07-24 DIAGNOSIS — Z743 Need for continuous supervision: Secondary | ICD-10-CM | POA: Diagnosis not present

## 2021-07-24 DIAGNOSIS — K2210 Ulcer of esophagus without bleeding: Secondary | ICD-10-CM | POA: Diagnosis not present

## 2021-07-24 DIAGNOSIS — J449 Chronic obstructive pulmonary disease, unspecified: Secondary | ICD-10-CM | POA: Diagnosis not present

## 2021-07-24 DIAGNOSIS — Z681 Body mass index (BMI) 19 or less, adult: Secondary | ICD-10-CM | POA: Diagnosis not present

## 2021-07-24 DIAGNOSIS — K31819 Angiodysplasia of stomach and duodenum without bleeding: Secondary | ICD-10-CM | POA: Diagnosis not present

## 2021-07-24 DIAGNOSIS — K449 Diaphragmatic hernia without obstruction or gangrene: Secondary | ICD-10-CM | POA: Diagnosis not present

## 2021-07-24 DIAGNOSIS — K2091 Esophagitis, unspecified with bleeding: Secondary | ICD-10-CM | POA: Diagnosis not present

## 2021-07-24 DIAGNOSIS — K921 Melena: Secondary | ICD-10-CM | POA: Diagnosis not present

## 2021-07-24 DIAGNOSIS — E440 Moderate protein-calorie malnutrition: Secondary | ICD-10-CM | POA: Diagnosis not present

## 2021-07-24 DIAGNOSIS — Z8719 Personal history of other diseases of the digestive system: Secondary | ICD-10-CM | POA: Diagnosis not present

## 2021-07-24 DIAGNOSIS — F32A Depression, unspecified: Secondary | ICD-10-CM | POA: Diagnosis not present

## 2021-07-24 DIAGNOSIS — Z7401 Bed confinement status: Secondary | ICD-10-CM | POA: Diagnosis not present

## 2021-07-24 DIAGNOSIS — G8929 Other chronic pain: Secondary | ICD-10-CM | POA: Diagnosis not present

## 2021-07-24 DIAGNOSIS — M4628 Osteomyelitis of vertebra, sacral and sacrococcygeal region: Secondary | ICD-10-CM | POA: Diagnosis not present

## 2021-07-24 DIAGNOSIS — K922 Gastrointestinal hemorrhage, unspecified: Secondary | ICD-10-CM | POA: Diagnosis not present

## 2021-07-25 DIAGNOSIS — J449 Chronic obstructive pulmonary disease, unspecified: Secondary | ICD-10-CM | POA: Diagnosis not present

## 2021-07-25 DIAGNOSIS — D509 Iron deficiency anemia, unspecified: Secondary | ICD-10-CM | POA: Diagnosis not present

## 2021-07-25 DIAGNOSIS — K208 Other esophagitis without bleeding: Secondary | ICD-10-CM | POA: Diagnosis not present

## 2021-07-25 DIAGNOSIS — K259 Gastric ulcer, unspecified as acute or chronic, without hemorrhage or perforation: Secondary | ICD-10-CM | POA: Diagnosis not present

## 2021-07-25 DIAGNOSIS — K31819 Angiodysplasia of stomach and duodenum without bleeding: Secondary | ICD-10-CM | POA: Diagnosis not present

## 2021-07-30 DIAGNOSIS — E440 Moderate protein-calorie malnutrition: Secondary | ICD-10-CM | POA: Diagnosis not present

## 2021-07-30 DIAGNOSIS — I959 Hypotension, unspecified: Secondary | ICD-10-CM | POA: Diagnosis not present

## 2021-07-30 DIAGNOSIS — K2100 Gastro-esophageal reflux disease with esophagitis, without bleeding: Secondary | ICD-10-CM | POA: Diagnosis not present

## 2021-07-30 DIAGNOSIS — K449 Diaphragmatic hernia without obstruction or gangrene: Secondary | ICD-10-CM | POA: Diagnosis not present

## 2021-07-30 DIAGNOSIS — M4628 Osteomyelitis of vertebra, sacral and sacrococcygeal region: Secondary | ICD-10-CM | POA: Diagnosis not present

## 2021-07-30 DIAGNOSIS — I2699 Other pulmonary embolism without acute cor pulmonale: Secondary | ICD-10-CM | POA: Diagnosis not present

## 2021-07-30 DIAGNOSIS — D62 Acute posthemorrhagic anemia: Secondary | ICD-10-CM | POA: Diagnosis not present

## 2021-07-30 DIAGNOSIS — J449 Chronic obstructive pulmonary disease, unspecified: Secondary | ICD-10-CM | POA: Diagnosis not present

## 2021-07-30 DIAGNOSIS — Z681 Body mass index (BMI) 19 or less, adult: Secondary | ICD-10-CM | POA: Diagnosis not present

## 2021-07-30 DIAGNOSIS — F32A Depression, unspecified: Secondary | ICD-10-CM | POA: Diagnosis not present

## 2021-07-30 DIAGNOSIS — I82401 Acute embolism and thrombosis of unspecified deep veins of right lower extremity: Secondary | ICD-10-CM | POA: Diagnosis not present

## 2021-07-30 DIAGNOSIS — Z8719 Personal history of other diseases of the digestive system: Secondary | ICD-10-CM | POA: Diagnosis not present

## 2021-07-30 DIAGNOSIS — F419 Anxiety disorder, unspecified: Secondary | ICD-10-CM | POA: Diagnosis not present

## 2021-07-30 DIAGNOSIS — L89154 Pressure ulcer of sacral region, stage 4: Secondary | ICD-10-CM | POA: Diagnosis not present

## 2021-08-30 DEATH — deceased

## 2021-09-08 ENCOUNTER — Other Ambulatory Visit: Payer: Self-pay | Admitting: *Deleted

## 2021-09-08 NOTE — Patient Outreach (Signed)
[facility]) Care Management  09/08/2021  Martin Smeal 01-15-1936 [id number]   [facility] Case closure - deceased  Review of chart and [place] prior to outreach Noted pt had 12/26-30/22 [facility] hospital admission for angiodysplasia of colon with hemorrhage, Unspecified severe protein-calorie malnutrition diagnoses. 07/28/21 to 07/30/2021 [facility] stay  Plan case closure deceased   [place] L. Lavina Hamman, RN, BSN, [place] Coordinator Office number [phone] Mobile number [phone]  Main [facility] number [id number] Fax number [phone]

## 2021-09-26 ENCOUNTER — Ambulatory Visit: Payer: Medicare Other | Admitting: Infectious Disease

## 2022-03-06 IMAGING — CT CT ABD-PELV W/ CM
2 of 5 series · 12 of 46 positions shown, 14 images · IV contrast (iopamidol)
Comparison: None.

CLINICAL DATA: Chronic abdominal pain and fatigue for approximately
2 years. Prior cholecystectomy and appendectomy.

Creatinine was obtained on site at [HOSPITAL] at [REDACTED].
Results: Creatinine 0.5 mg/dL.
EXAM:
CT ABDOMEN AND PELVIS WITH CONTRAST
TECHNIQUE: Multidetector CT imaging of the abdomen and pelvis was performed
using the standard protocol following bolus administration of
intravenous contrast.
CONTRAST:  100mL [id number] IOPAMIDOL ([id number]) INJECTION 61%

[Series 2: abd pelvis 5.00 br40 s3 axial · axial · 0.54mm/px · z∈[+1354,+1674]mm · 9 of 76 slices shown, 11 images]
[im 8/76  soft-tissue]
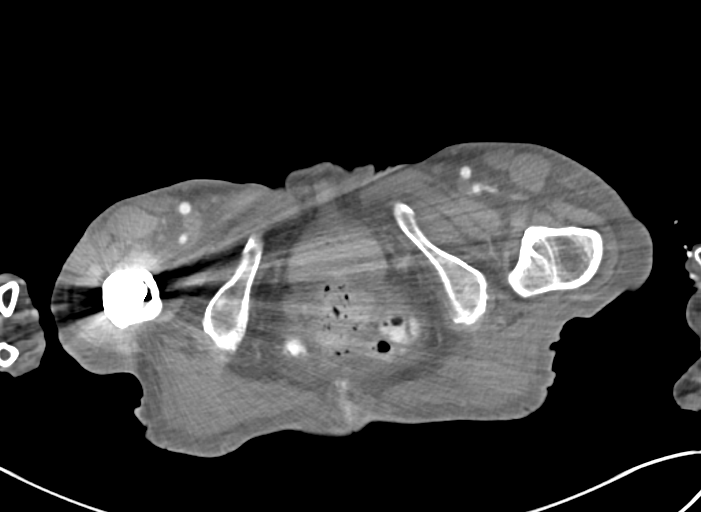
[im 8/76  bone]
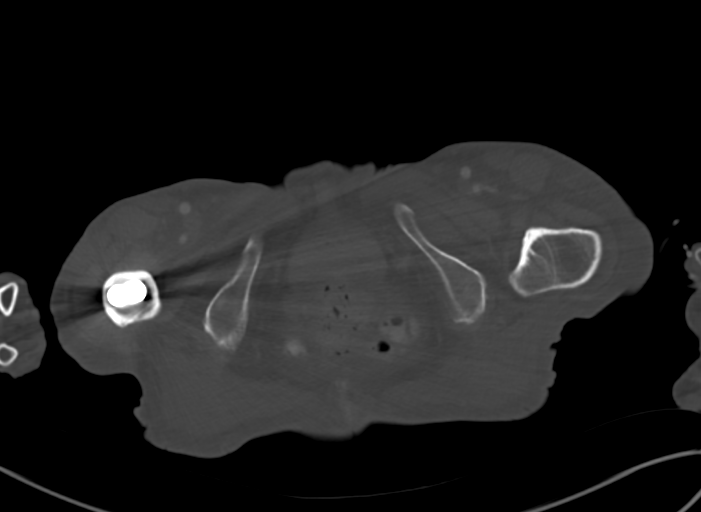
[im 16/76  soft-tissue]
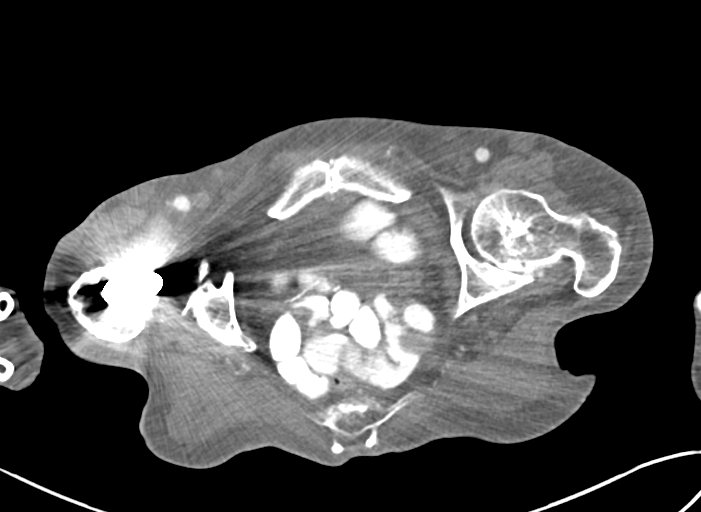
[im 24/76  soft-tissue]
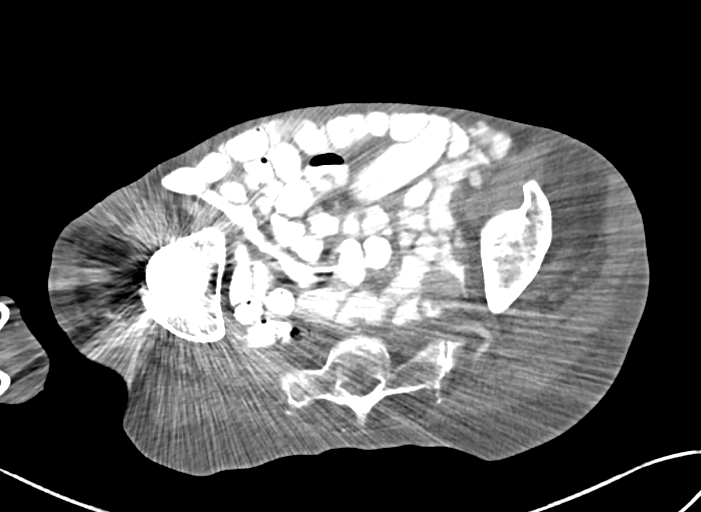
[im 32/76  soft-tissue]
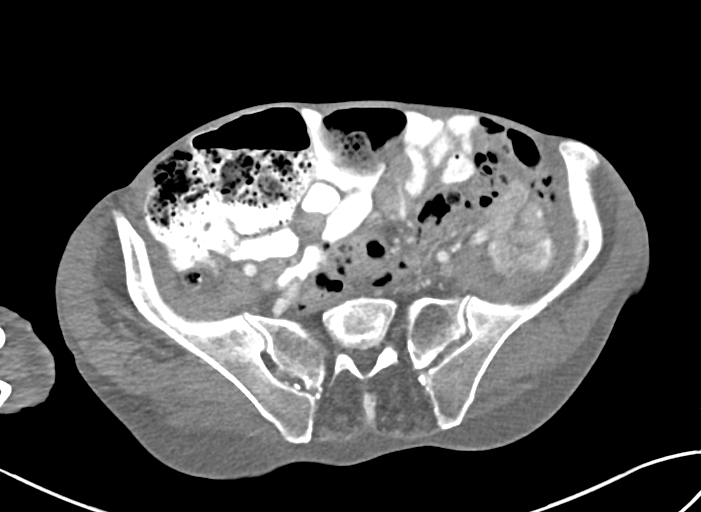
[im 40/76  soft-tissue]
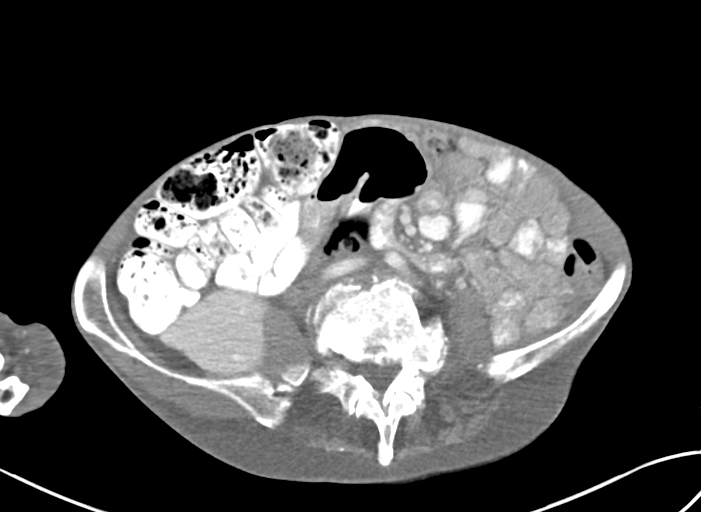
[im 48/76  soft-tissue]
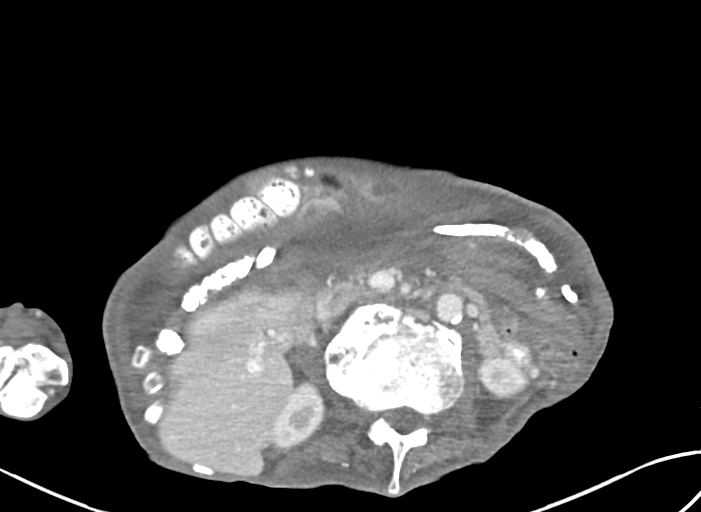
[im 56/76  soft-tissue]
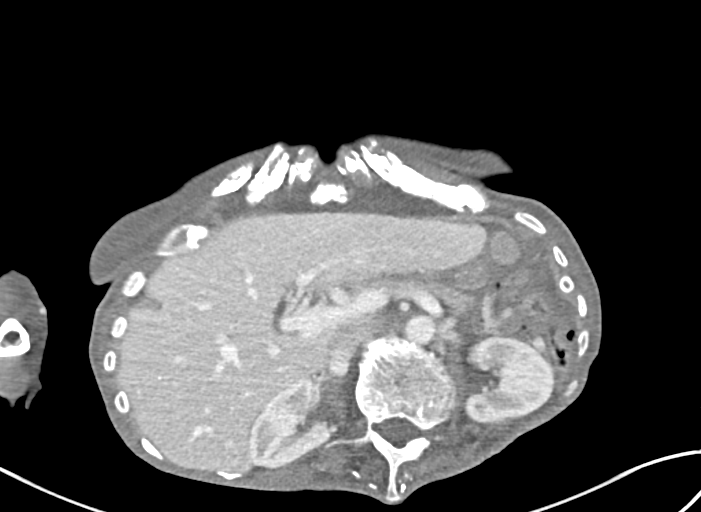
[im 64/76  soft-tissue]
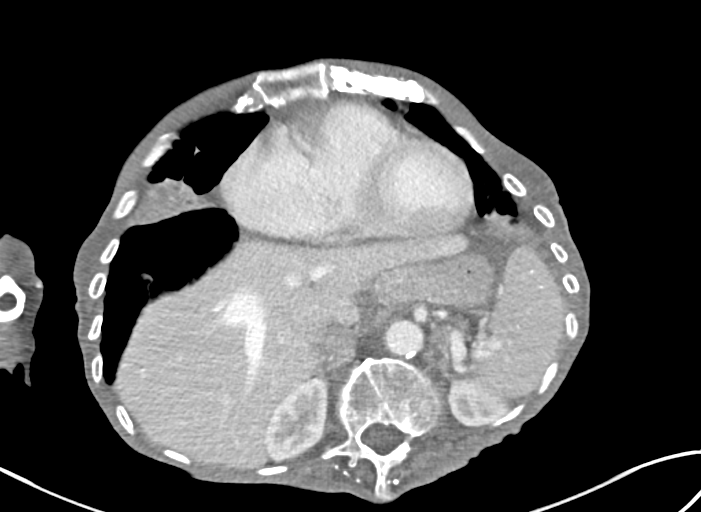
[im 72/76  soft-tissue]
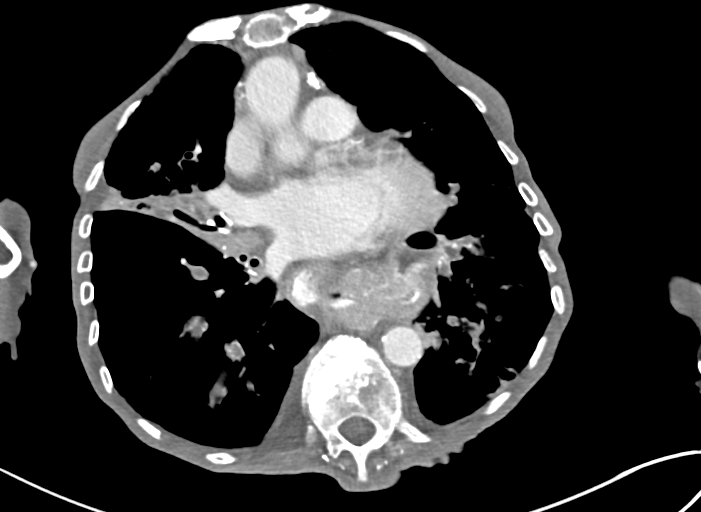
[im 72/76  bone]
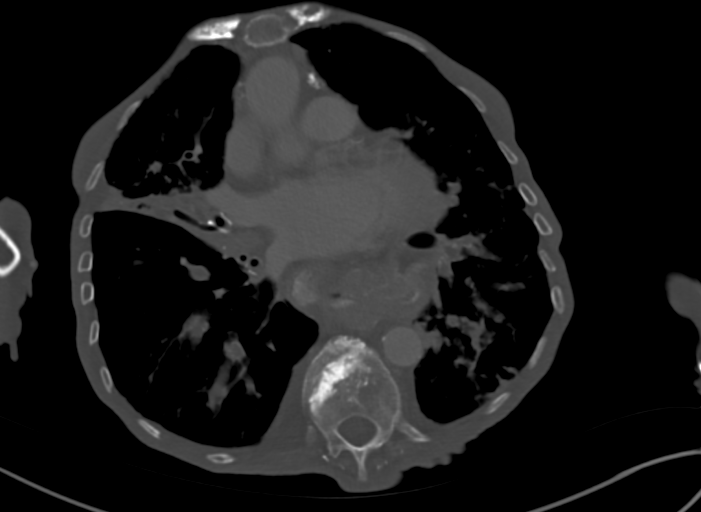

[Series 6: abd pelvis 2.00 br40 s3 cor · coronal · 0.71mm/px · 3 of 138 slices shown]
[im 46/138  soft-tissue]
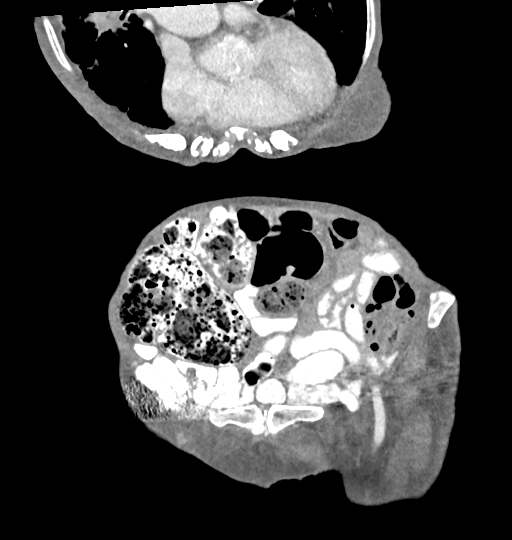
[im 61/138  soft-tissue]
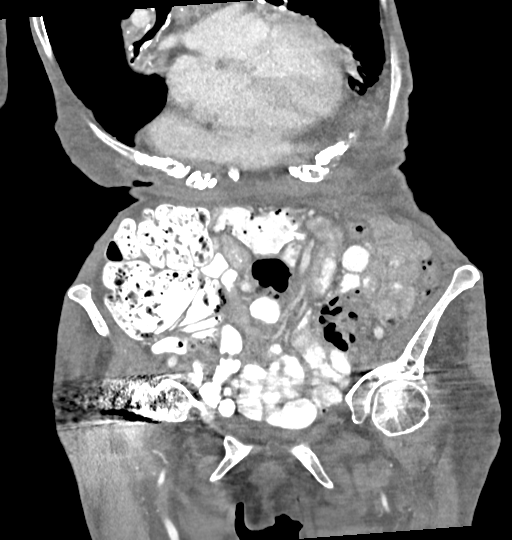
[im 77/138  soft-tissue]
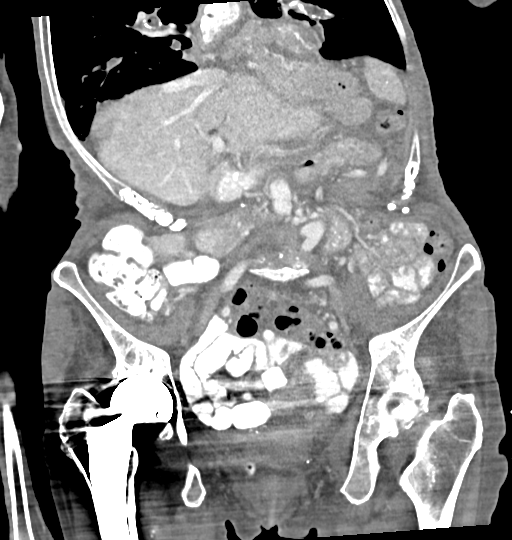

[12 of 46 positions shown; findings below may reference images not displayed]

FINDINGS: Lower Chest: Bronchiectasis with extensive mucoid impaction is seen
throughout visualized portion of both lung bases. Atelectasis or
scarring is also seen in the visualized portion of the right middle
lobe.

Hepatobiliary: No hepatic masses identified. Prior cholecystectomy.
No evidence of biliary obstruction.

Pancreas:  No mass or inflammatory changes.

Spleen: Within normal limits in size and appearance.

Adrenals/Urinary Tract: No masses identified. No evidence of
ureteral calculi or hydronephrosis.

Stomach/Bowel: Moderate size hiatal hernia is seen. No evidence of
obstruction, inflammatory process or abnormal fluid collections.
Diverticulosis is seen mainly involving the sigmoid colon, however
there is no evidence of diverticulitis.

Vascular/Lymphatic: No pathologically enlarged lymph nodes. No
abdominal aortic aneurysm. Aortic atherosclerotic calcification
noted.

Reproductive:  No mass or other significant abnormality.

Other:  None.

Musculoskeletal: No suspicious bone lesions identified. Right hip
prosthesis noted. Thoracolumbar kyphosis seen, with old wedge
compression fracture of the L4 vertebral body. Advanced lumbar spine
degenerative changes also noted.
IMPRESSION: No acute findings within the abdomen or pelvis.

Moderate hiatal hernia.

Colonic diverticulosis, without radiographic evidence of
diverticulitis.

Bronchiectasis with extensive mucoid impaction in visualized portion
of both lung bases. These findings are nonspecific, and differential
diagnosis includes chronic aspiration and allergic bronchopulmonary
aspergillosis. Consider chest CT with contrast for further
evaluation.
